# Patient Record
Sex: Female | Born: 1995 | Race: White | Hispanic: No | Marital: Married | Smoking: Never smoker
Health system: Southern US, Community
[De-identification: ages and names within clinical notes are randomized; demographics above are authoritative.]

## PROBLEM LIST (undated history)

## (undated) DIAGNOSIS — R51 Headache: Secondary | ICD-10-CM

## (undated) DIAGNOSIS — R519 Headache, unspecified: Secondary | ICD-10-CM

## (undated) HISTORY — PX: APPENDECTOMY: SHX54

## (undated) HISTORY — PX: OTHER SURGICAL HISTORY: SHX169

---

## 2018-05-25 ENCOUNTER — Ambulatory Visit: Payer: Self-pay | Admitting: Family Medicine

## 2018-06-01 LAB — OB RESULTS CONSOLE RUBELLA ANTIBODY, IGM: Rubella: IMMUNE

## 2018-06-01 LAB — OB RESULTS CONSOLE VARICELLA ZOSTER ANTIBODY, IGG: Varicella: IMMUNE

## 2018-06-01 LAB — OB RESULTS CONSOLE HIV ANTIBODY (ROUTINE TESTING): HIV: NONREACTIVE

## 2018-06-01 LAB — OB RESULTS CONSOLE HEPATITIS B SURFACE ANTIGEN: Hepatitis B Surface Ag: NEGATIVE

## 2018-06-29 LAB — OB RESULTS CONSOLE HGB/HCT, BLOOD
HCT: 41 (ref 29–41)
HEMOGLOBIN: 14
Hemoglobin: 14

## 2018-06-29 LAB — OB RESULTS CONSOLE PLATELET COUNT: Platelets: 393

## 2018-06-29 LAB — OB RESULTS CONSOLE ABO/RH

## 2018-07-03 ENCOUNTER — Ambulatory Visit (INDEPENDENT_AMBULATORY_CARE_PROVIDER_SITE_OTHER): Payer: Managed Care, Other (non HMO) | Admitting: Obstetrics and Gynecology

## 2018-07-03 ENCOUNTER — Ambulatory Visit: Payer: Managed Care, Other (non HMO) | Admitting: Certified Nurse Midwife

## 2018-07-03 ENCOUNTER — Encounter: Payer: Self-pay | Admitting: Obstetrics and Gynecology

## 2018-07-03 ENCOUNTER — Encounter: Payer: Self-pay | Admitting: Certified Nurse Midwife

## 2018-07-03 VITALS — BP 117/66 | HR 94 | Ht 63.0 in | Wt 125.3 lb

## 2018-07-03 DIAGNOSIS — Z3492 Encounter for supervision of normal pregnancy, unspecified, second trimester: Secondary | ICD-10-CM

## 2018-07-03 NOTE — Progress Notes (Signed)
Pt presents today as a NOB transfer from [facility]. Pt has no concerns, only wants to establish care.

## 2018-07-03 NOTE — Progress Notes (Signed)
Error. Pt not seen she request to see an MD.

## 2018-07-03 NOTE — Progress Notes (Signed)
NOB TRANSFER: 13 weeks and 2 days by early ultrasound.  Her first pregnancy was complicated by macrosomic infant delivered at term and a subsequent fourth degree tear.  Patient has done a lot of research regarding another vaginal delivery and she desires another vaginal delivery as opposed to C-section.  She did receive her initial prenatal labs and care at [facility] and she is obtaining records for [place].  She has no complaints today-reports nausea but rare vomiting and says this is resolving.  We have discussed genetic testing in the first trimester and she has declined this at this time.  She is still considering quad screen testing at her next visit.  Once we have obtained records we will determine any additional labs that are required.  Consider early 1 hour GCT because of history of macrosomia.  Patient says she had an examination at [facility] and they also told her she did not require a Pap smear as she was "up-to-date".

## 2018-07-24 ENCOUNTER — Encounter: Payer: Self-pay | Admitting: Obstetrics and Gynecology

## 2018-07-24 ENCOUNTER — Ambulatory Visit (INDEPENDENT_AMBULATORY_CARE_PROVIDER_SITE_OTHER): Payer: Managed Care, Other (non HMO) | Admitting: Obstetrics and Gynecology

## 2018-07-24 VITALS — BP 111/71 | HR 75 | Wt 126.7 lb

## 2018-07-24 DIAGNOSIS — Z3482 Encounter for supervision of other normal pregnancy, second trimester: Secondary | ICD-10-CM

## 2018-07-24 DIAGNOSIS — O09292 Supervision of pregnancy with other poor reproductive or obstetric history, second trimester: Secondary | ICD-10-CM

## 2018-07-24 LAB — POCT URINALYSIS DIPSTICK OB
Bilirubin, UA: NEGATIVE
Glucose, UA: NEGATIVE
Ketones, UA: NEGATIVE
LEUKOCYTES UA: NEGATIVE
NITRITE UA: NEGATIVE
PH UA: 7 (ref 5.0–8.0)
PROTEIN: NEGATIVE
RBC UA: NEGATIVE
SPEC GRAV UA: 1.01 (ref 1.010–1.025)
UROBILINOGEN UA: 0.2 U/dL

## 2018-07-24 NOTE — Progress Notes (Signed)
ROB-pt is present today for prenatal care. Pt stated that she is doing well no complaints. Pt had flu vaccine on 06/27/18.

## 2018-07-24 NOTE — Progress Notes (Signed)
ROB: Doing well today no complaints. Discussed need for early glucola due to h/o fetal macrosomia. Declines all genetic testing. Up to date on flu vaccine. RTC in 4 weeks, for anatomy scan then.

## 2018-07-24 NOTE — Patient Instructions (Signed)

## 2018-08-21 ENCOUNTER — Ambulatory Visit (INDEPENDENT_AMBULATORY_CARE_PROVIDER_SITE_OTHER): Payer: Managed Care, Other (non HMO) | Admitting: Obstetrics and Gynecology

## 2018-08-21 ENCOUNTER — Ambulatory Visit (INDEPENDENT_AMBULATORY_CARE_PROVIDER_SITE_OTHER): Payer: Managed Care, Other (non HMO)

## 2018-08-21 VITALS — BP 120/74 | HR 76 | Wt 129.0 lb

## 2018-08-21 DIAGNOSIS — Z3482 Encounter for supervision of other normal pregnancy, second trimester: Secondary | ICD-10-CM

## 2018-08-21 LAB — POCT URINALYSIS DIPSTICK OB
Bilirubin, UA: NEGATIVE
GLUCOSE, UA: NEGATIVE
KETONES UA: NEGATIVE
LEUKOCYTES UA: NEGATIVE
NITRITE UA: NEGATIVE
POC,PROTEIN,UA: NEGATIVE
RBC UA: NEGATIVE
Spec Grav, UA: 1.015 (ref 1.010–1.025)
Urobilinogen, UA: 0.2 E.U./dL
pH, UA: 7 (ref 5.0–8.0)

## 2018-08-21 NOTE — Progress Notes (Signed)
Pt here today for ROB and anatomy scan. Pt is doing well and has no concerns at this time.  BP 120/74   Pulse 76   Wt 129 lb (58.5 kg)   BMI 22.85 kg/m

## 2018-08-21 NOTE — Progress Notes (Signed)
ROB:  No complaints - FAS today.  Feels active fetal movement.

## 2018-08-27 ENCOUNTER — Telehealth: Payer: Self-pay

## 2018-08-27 NOTE — Telephone Encounter (Signed)
Received a telephone advice fax for pt. They advised pt to go the ED. Pt did not go to the ED. Pt was called no answer LM via voicemail for the reason for the call was to check on her to see how she was doing. Pt was advised to call the office.

## 2018-09-19 NOTE — L&D Delivery Note (Signed)
       Delivery Note   Emily Farley is a 23 y.o. G2P1001 at [redacted]w[redacted]d Estimated Date of Delivery: 01/07/19  PRE-OPERATIVE DIAGNOSIS:  1) [redacted]w[redacted]d pregnancy.  2) history of macrosomia with subsequent fourth degree tear  POST-OPERATIVE DIAGNOSIS:  1) [redacted]w[redacted]d pregnancy s/p Vaginal, Spontaneous   2) viable female infant  Delivery Type: Vaginal, Spontaneous    Delivery Anesthesia: Epidural   Labor Complications:   None  ESTIMATED BLOOD LOSS: 140  ml    FINDINGS:   1) female infant, Apgar scores of 8    at 1 minute and 9    at 5 minutes and a birthweight of    ounces.    2) Nuchal cord: NO  SPECIMENS:   PLACENTA:   Appearance: Intact    Removal: Spontaneous      Disposition:     DISPOSITION:  Infant to left in stable condition in the delivery room, with L&D personnel and mother,  NARRATIVE SUMMARY: Labor course:  [name] is a G2P1001 at [redacted]w[redacted]d who presented for induction of labor.  She received intravaginal misoprostol and underwent artificial rupture membranes.  She progressed well in labor without pitocin.  She received the appropriate anesthesia and proceeded to complete dilation. She evidenced good maternal expulsive effort during the second stage. She went on to deliver a viable infant. The placenta delivered without problems and was noted to be complete. A perineal and vaginal examination was performed. Episiotomy/Lacerations: 2nd degree;Perineal  Episiotomy or lacerations were repaired with Vicryl suture using local anesthesia. The patient tolerated this well.  Elonda Husky, M.D. 01/11/2019 1:56 PM

## 2018-09-20 ENCOUNTER — Other Ambulatory Visit: Payer: Self-pay | Admitting: Obstetrics and Gynecology

## 2018-09-20 ENCOUNTER — Ambulatory Visit (INDEPENDENT_AMBULATORY_CARE_PROVIDER_SITE_OTHER): Payer: Managed Care, Other (non HMO)

## 2018-09-20 ENCOUNTER — Ambulatory Visit (INDEPENDENT_AMBULATORY_CARE_PROVIDER_SITE_OTHER): Payer: Managed Care, Other (non HMO) | Admitting: Obstetrics and Gynecology

## 2018-09-20 VITALS — BP 101/66 | HR 73 | Wt 134.4 lb

## 2018-09-20 DIAGNOSIS — Z3492 Encounter for supervision of normal pregnancy, unspecified, second trimester: Secondary | ICD-10-CM

## 2018-09-20 DIAGNOSIS — Z130 Encounter for screening for diseases of the blood and blood-forming organs and certain disorders involving the immune mechanism: Secondary | ICD-10-CM

## 2018-09-20 DIAGNOSIS — Z3482 Encounter for supervision of other normal pregnancy, second trimester: Secondary | ICD-10-CM

## 2018-09-20 DIAGNOSIS — Z131 Encounter for screening for diabetes mellitus: Secondary | ICD-10-CM

## 2018-09-20 LAB — POCT URINALYSIS DIPSTICK OB
Bilirubin, UA: NEGATIVE
Blood, UA: NEGATIVE
Glucose, UA: NEGATIVE
Ketones, UA: NEGATIVE
Leukocytes, UA: NEGATIVE
Nitrite, UA: NEGATIVE
POC,PROTEIN,UA: NEGATIVE
Spec Grav, UA: 1.02 (ref 1.010–1.025)
UROBILINOGEN UA: 0.2 U/dL
pH, UA: 5 (ref 5.0–8.0)

## 2018-09-20 NOTE — Progress Notes (Signed)
ROB-Pt stated that she is doing well no complaints.  

## 2018-09-20 NOTE — Progress Notes (Signed)
ROB: Doing well, no complaints. RTC in 4 weeks. For 28 week labs at that time.

## 2018-10-18 ENCOUNTER — Other Ambulatory Visit: Payer: Managed Care, Other (non HMO)

## 2018-10-18 ENCOUNTER — Ambulatory Visit (INDEPENDENT_AMBULATORY_CARE_PROVIDER_SITE_OTHER): Payer: Managed Care, Other (non HMO) | Admitting: Obstetrics and Gynecology

## 2018-10-18 ENCOUNTER — Encounter: Payer: Self-pay | Admitting: Obstetrics and Gynecology

## 2018-10-18 VITALS — BP 102/69 | HR 69 | Ht 63.0 in | Wt 138.7 lb

## 2018-10-18 DIAGNOSIS — Z3482 Encounter for supervision of other normal pregnancy, second trimester: Secondary | ICD-10-CM

## 2018-10-18 DIAGNOSIS — Z23 Encounter for immunization: Secondary | ICD-10-CM

## 2018-10-18 DIAGNOSIS — Z130 Encounter for screening for diseases of the blood and blood-forming organs and certain disorders involving the immune mechanism: Secondary | ICD-10-CM

## 2018-10-18 DIAGNOSIS — O09292 Supervision of pregnancy with other poor reproductive or obstetric history, second trimester: Secondary | ICD-10-CM

## 2018-10-18 LAB — POCT URINALYSIS DIPSTICK OB
Bilirubin, UA: NEGATIVE
Blood, UA: NEGATIVE
Glucose, UA: NEGATIVE
KETONES UA: NEGATIVE
Leukocytes, UA: NEGATIVE
Nitrite, UA: NEGATIVE
POC,PROTEIN,UA: NEGATIVE
Spec Grav, UA: 1.01 (ref 1.010–1.025)
Urobilinogen, UA: 0.2 E.U./dL
pH, UA: 7 (ref 5.0–8.0)

## 2018-10-18 NOTE — Progress Notes (Signed)
Patient comes in today for ROB visit. She has no concerns today. Tdap given today. Blood transfusion form signed today. She is having RPR, CBC, and 1 hour glucose today.

## 2018-10-18 NOTE — Progress Notes (Signed)
ROB: Patient doing well without complaint.  1 hour GCT today, Tdap today.  Watch fundal height consider ultrasound if necessary.

## 2018-10-19 LAB — GLUCOSE, 1 HOUR GESTATIONAL: GESTATIONAL DIABETES SCREEN: 80 mg/dL (ref 65–139)

## 2018-10-19 LAB — CBC
HEMATOCRIT: 37.9 % (ref 34.0–46.6)
Hemoglobin: 13.2 g/dL (ref 11.1–15.9)
MCH: 30.8 pg (ref 26.6–33.0)
MCHC: 34.8 g/dL (ref 31.5–35.7)
MCV: 88 fL (ref 79–97)
Platelets: 273 10*3/uL (ref 150–450)
RBC: 4.29 x10E6/uL (ref 3.77–5.28)
RDW: 12.3 % (ref 11.7–15.4)
WBC: 13.8 10*3/uL — ABNORMAL HIGH (ref 3.4–10.8)

## 2018-10-19 LAB — RPR: RPR Ser Ql: NONREACTIVE

## 2018-11-13 ENCOUNTER — Ambulatory Visit (INDEPENDENT_AMBULATORY_CARE_PROVIDER_SITE_OTHER): Payer: Managed Care, Other (non HMO) | Admitting: Obstetrics and Gynecology

## 2018-11-13 ENCOUNTER — Encounter: Payer: Self-pay | Admitting: Obstetrics and Gynecology

## 2018-11-13 VITALS — BP 103/67 | HR 84 | Wt 145.4 lb

## 2018-11-13 DIAGNOSIS — Z3483 Encounter for supervision of other normal pregnancy, third trimester: Secondary | ICD-10-CM

## 2018-11-13 LAB — POCT URINALYSIS DIPSTICK OB
Bilirubin, UA: NEGATIVE
Blood, UA: NEGATIVE
Glucose, UA: NEGATIVE
KETONES UA: NEGATIVE
Leukocytes, UA: NEGATIVE
Nitrite, UA: NEGATIVE
POC,PROTEIN,UA: NEGATIVE
Spec Grav, UA: 1.02 (ref 1.010–1.025)
Urobilinogen, UA: 0.2 E.U./dL
pH, UA: 6.5 (ref 5.0–8.0)

## 2018-11-13 NOTE — Patient Instructions (Signed)
Pain Relief During Labor and Delivery  Many things can cause pain during labor and delivery, including:  · Pressure on bones and ligaments due to the baby moving through the pelvis.  · Stretching of tissues due to the baby moving through the birth canal.  · Muscle tension due to anxiety or nervousness.  · The uterus tightening (contracting) and relaxing to help move the baby.  There are many ways to deal with the pain of labor and delivery. They include:  · Taking prenatal classes. Taking these classes helps you know what to expect during your baby’s birth. What you learn will increase your confidence and decrease your anxiety.  · Practicing relaxation techniques or doing relaxing activities, such as:  ? Focused breathing.  ? Meditation.  ? Visualization.  ? Aroma therapy.  ? Listening to your favorite music.  ? Hypnosis.  · Taking a warm shower or bath (hydrotherapy). This may:  ? Provide comfort and relaxation.  ? Lessen your perception of pain.  ? Decrease the amount of pain medicine needed.  ? Decrease the length of labor.  · Getting a massage or counterpressure on your back.  · Applying warm packs or ice packs.  · Changing positions often, moving around, or using a birthing ball.  · Getting:  ? Pain medicine through an IV or injection into a muscle.  ? Pain medicine inserted into your spinal column.  ? Injections of sterile water just under the skin on your lower back (intradermal injections).  ? Laughing gas (nitrous oxide).  Discuss your pain control options with your health care provider during your prenatal visits. Explore the options offered by your hospital or birth center.  What kinds of medicine are available?  There are two kinds of medicines that can be used to relieve pain during labor and delivery:  · Analgesics. These medicines decrease pain without causing you to lose feeling or the ability to move your muscles.  · Anesthetics. These medicines block feeling in the body and can decrease your  ability to move freely.  Both of these kinds of medicine can cause minor side effects, such as nausea, trouble concentrating, and sleepiness. They can also decrease the baby's heart rate before birth and affect the baby’s breathing rate after birth. For this reason, health care providers are careful about when and how much medicine is given.  What are specific medicines and procedures that provide pain relief?  Local Anesthetics  Local anesthetics are used to numb a small area of the body. They may be used along with another kind of anesthetic or used to numb the nerves of the vagina, cervix, and perineum during the second stage of labor.  General Anesthetics  General anesthetics cause you to lose consciousness so you do not feel pain. They are usually only used for an emergency cesarean delivery. General anesthetics are given through an IV tube and a mask.  Pudendal Block  A pudendal block is a form of local anesthetic. It may be used to relieve the pain associated with pushing or stretching of the perineum at the time of delivery or to further numb the perineum. A pudendal block is done by injecting numbing medicine through the vaginal wall into a nerve in the pelvis.  Epidural Analgesia  Epidural analgesia is given through a flexible IV catheter that is inserted into the lower back. Numbing medicine is delivered continuously to the area near your spinal column nerves (epidural space). After having this type of analgesia, you   may be able to move your legs but you most likely will not be able to walk. Depending on the amount of medicine given, you may lose all feeling in the lower half of your body, or you may retain some level of sensation, including the urge to push. Epidural analgesia can be used to provide pain relief for a vaginal birth.  Spinal Block  A spinal block is similar to epidural analgesia, but the medicine is injected into the spinal fluid instead of the epidural space. A spinal block is only given  once. It starts to relieve pain quickly, but the pain relief lasts only 1-6 hours. Spinal blocks can be used for cesarean deliveries.  Combined Spinal-Epidural (CSE) Block  A CSE block combines the effects of a spinal block and epidural analgesia. The spinal block works quickly to block all pain. The epidural analgesia provides continuous pain relief, even after the effects of the spinal block have worn off.  This information is not intended to replace advice given to you by your health care provider. Make sure you discuss any questions you have with your health care provider.  Document Released: 12/22/2008 Document Revised: 02/12/2016 Document Reviewed: 01/27/2016  Elsevier Interactive Patient Education © 2019 Elsevier Inc.

## 2018-11-13 NOTE — Progress Notes (Signed)
ROB: Doing well, no complaints.  Desires circumcision for female infant. Discussed pain management, unsure if she desires epidural this time around, but worried about having another large baby. Discussed concerns. RTC in 2 weeks. Continue to monitor fetal growth.

## 2018-11-13 NOTE — Progress Notes (Signed)
ROB-pt present today for routine prenatal care. Pt stated that she is doing well no problems.

## 2018-11-27 ENCOUNTER — Ambulatory Visit (INDEPENDENT_AMBULATORY_CARE_PROVIDER_SITE_OTHER): Payer: Managed Care, Other (non HMO) | Admitting: Obstetrics and Gynecology

## 2018-11-27 ENCOUNTER — Encounter: Payer: Self-pay | Admitting: Obstetrics and Gynecology

## 2018-11-27 VITALS — BP 101/65 | HR 84 | Ht 63.0 in | Wt 146.8 lb

## 2018-11-27 DIAGNOSIS — Z3483 Encounter for supervision of other normal pregnancy, third trimester: Secondary | ICD-10-CM

## 2018-11-27 LAB — POCT URINALYSIS DIPSTICK OB
Bilirubin, UA: NEGATIVE
Blood, UA: NEGATIVE
Glucose, UA: NEGATIVE
KETONES UA: NEGATIVE
Leukocytes, UA: NEGATIVE
Nitrite, UA: NEGATIVE
PROTEIN: NEGATIVE
Spec Grav, UA: 1.01 (ref 1.010–1.025)
Urobilinogen, UA: 0.2 E.U./dL
pH, UA: 6 (ref 5.0–8.0)

## 2018-11-27 NOTE — Progress Notes (Signed)
Patient comes in today for ROB visit. No concerns today. 

## 2018-11-27 NOTE — Progress Notes (Signed)
ROB: Patient without complaint.  Cultures next visit.

## 2018-12-10 ENCOUNTER — Telehealth: Payer: Self-pay

## 2018-12-10 NOTE — Telephone Encounter (Signed)
Pt called no answer LM to call the office to go over covid-19 questions pre-screening before her visit.

## 2018-12-10 NOTE — Telephone Encounter (Signed)
Pt called no answer went straight to voicemail left message via voicemail that I was calling to go over pre-screening questions. Pt was advised to please call the office as soon as possible.

## 2018-12-11 ENCOUNTER — Encounter: Payer: Managed Care, Other (non HMO) | Admitting: Obstetrics and Gynecology

## 2018-12-11 ENCOUNTER — Telehealth: Payer: Self-pay

## 2018-12-11 NOTE — Telephone Encounter (Signed)
Pt called to see if she could come in tomorrow morning at 8am instead of Thursday morning at 8am. Message was left asking pt to call to see if she was able to change her appointment or not.

## 2018-12-13 ENCOUNTER — Other Ambulatory Visit: Payer: Self-pay

## 2018-12-13 ENCOUNTER — Ambulatory Visit (INDEPENDENT_AMBULATORY_CARE_PROVIDER_SITE_OTHER): Payer: Managed Care, Other (non HMO) | Admitting: Obstetrics and Gynecology

## 2018-12-13 ENCOUNTER — Encounter: Payer: Self-pay | Admitting: Obstetrics and Gynecology

## 2018-12-13 VITALS — BP 112/71 | HR 98 | Wt 150.1 lb

## 2018-12-13 DIAGNOSIS — Z3483 Encounter for supervision of other normal pregnancy, third trimester: Secondary | ICD-10-CM

## 2018-12-13 LAB — POCT URINALYSIS DIPSTICK OB
BILIRUBIN UA: NEGATIVE
Glucose, UA: NEGATIVE
Ketones, UA: NEGATIVE
Leukocytes, UA: NEGATIVE
Nitrite, UA: NEGATIVE
POC,PROTEIN,UA: NEGATIVE
RBC UA: NEGATIVE
Spec Grav, UA: 1.01 (ref 1.010–1.025)
Urobilinogen, UA: 0.2 E.U./dL
pH, UA: 7.5 (ref 5.0–8.0)

## 2018-12-13 LAB — OB RESULTS CONSOLE GC/CHLAMYDIA: Gonorrhea: NEGATIVE

## 2018-12-13 LAB — OB RESULTS CONSOLE GBS: GBS: NEGATIVE

## 2018-12-13 NOTE — Progress Notes (Signed)
ROB-Pt present today for prenatal care. Pt stated that she was doing well. Pt stated Fetal movement present, no pressure and pain and  contractions expect for braxton hicks and vaginal bleeding.

## 2018-12-13 NOTE — Patient Instructions (Signed)
                                                                                                                 FREQUENTLY ASKED QUESTIONS FOR OBSTETRICS/PEDIATRICS    Q: Why are visitor restrictions different for maternity care areas?  [place] is restricting visitors for the duration of the patient's hospitalization. The birth of a child involves the mother, considered the patient, and a birthing partner. These are unprecedented times and we are making the exception to allow a birthing partner to be a part of the patient unit. No other guests will be allowed in our Women's & Children's Center at [facility] and at [facility].   Q: Are credentialed doulas allowed to support their existing patients?  We acknowledge the value these doula partnerships offer our care teams and many birthing families in our communities. Each laboring mother is allowed one birthing partner of the patient's choosing for her entire hospitalization.   Q: Are visitor restrictions different for hospitalized children?  Pediatric patients (infants and children under 17 years of age), such as those in the Children's Unit, Pediatric ICU and NICU, will be allowed two visitors (parents or legal guardians)   Q: Are pregnant women at an increased risk for COVID-19?  The American College of Obstetricians and Gynecologists (ACOG) is monitoring closely the coronavirus pandemic. With the limited information available, data does not indicate pregnant women are at an increased risk. However, pregnant women are known to be at greater risk for respiratory infections like flu. With that in mind, expectant mothers are considered an at-risk population for COVID-19, according to ACOG.   Q: Are newborns at an increased risk for COVID-19?  A limited sample of COVID-19 data with newborns indicates the virus is not transferred to the infant during pregnancy. However, postpartum separation is recommended by the Centers for  Disease Control (CDC). As a result [place] recommends and strongly encourages temporary separation of moms and babies who test positive for COVID-19 or are awaiting results to rule out COVID-19 based on CDC guidelines.   Q: If you have a suspected case of COVID-19, is the NICU couplet care room an option?  No. If either patient is considered at-risk for having COVID-19, the Women's & Children's Center at [facility] will not use the NICU couplet care rooms for that family.   Q: [place] is urging that elective procedures be postponed. What is considered elective for women's and children's service line?  NOT ELECTIVE: Obstetric procedures, even those with an element of choice on timing, are not considered elective. Circumcisions are considered elective procedures, however, these do not deplete blood products and other resources, which is the spirit in which the COVID-19 postponement of elective procedures was intended. Therefore, circumcisions will be allowed.   ELECTIVE: Postpartum tubal ligations are considered elective and should be postponed. Q&A for Obstetricians, Gynecologists and Pediatricians  Published December 07, 2018   [place] supports as much as possible the medical care   team working with the patient's individual needs to address timing during these unprecedented times. We seek the support of our medical care team in preserving needed resources throughout our crisis response to COVID-19.   Q: How does COVID-19 impact breastfeeding?  Breastmilk is safe for your baby - even if the mother has tested positive for COVID-19. If a COVID-19+ mother decides to breastfeed while inpatient and after discharge, we suggest proper protective equipment be worn and hand hygiene be performed before and after feeding the infant. The new mother also has the option to pump her milk and have a healthy family member feed the baby to protect the baby from getting the virus.   Q: Should we urge  patients to avoid baby showers and large gatherings?  Yes. As has been recommended for all citizens in our communities, gatherings of 10 or more should be avoided - pregnant or not. Seek creative options for "hosting" baby showers through electronic means that honor the request for social distancing during this time of heightened awareness.   Q: Should patients miss their prenatal appointments?  No. Prenatal visits are NOT elective. While we want to limit contact and exposure, prenatal care is vital right now. Contact your physician's office if you have concerns about your visits. We are limiting outpatient office visits to the patient and one guest in order to reduce the potential for exposure.   Q: What if a pregnant woman feels sick? Should she miss her prenatal visit then?  A pregnant woman experiencing coronavirus-like symptoms (i.e., cough, fever, difficulty breathing, shortness of breath, gastrointestinal issues) should contact her pregnancy care provider by phone. Her medical professional can best determine whether she should use a video visit or possibly go to a collection site to be tested for COVID-19. Contacting her primary care provider or her pregnancy care provider is her first step.   Q: What can I do about childbirth education? All the classes are cancelled.  The Women's [facility] Children's Centers will offer online learning to support mothers on their journey. We currently offer Understanding Childbirth, Understanding Breastfeeding and Understanding Newborn Care as an online class. Please visit our website, [email or web address], to register for an online class.   Q: How can I keep from getting COVID-19? Q&A for Obstetricians, Gynecologists and Pediatricians  Published December 07, 2018   Together, we can reduce the risk of exposure to the virus and help you and your family remain healthy and safe. One of the best ways to protect yourself is to wash your hands frequently using soap and  water. Also, you should avoid touching your eyes, nose and mouth with unwashed hands, avoid physical contact with others and practice social distancing.   Q: How are employees being informed about what to do?  [place] leaders receive a daily COVID-19 update and share relevant information with their teams. This is a time when health care professionals are called on to lead within our community. We appreciate our staff's engagement with our COVID-19 updates and encourage them to share best practices on reducing the spread of the virus with our patients and community. We are prepared to provide the exceptional COVID-19 care and coordination our community needs, expects and deserves.   Q: Who's in charge of this issue at [facility]?  The leadership structure and process established to address COVID-19 includes Chief Physician Executive Bruce Swords, MD; Infection Prevention Medical Director Cynthia Snider, MD; and Infection Prevention Interim Director Sara Wall, MSN, RN, CIC, CSPDT. A team   of [place] experts reflecting a broad spectrum of our workforce is meeting daily to evaluate new information we receive about COVID-19 and to adapt policies and practices accordingly.                         Published December 07, 2018    

## 2018-12-13 NOTE — Progress Notes (Signed)
ROB: Patient doing well, no complaints. Discussed coronavirus in pregnancy, hospital restrictions.  36 week labs done today.  RTC in 2 weeks.

## 2018-12-15 LAB — STREP GP B NAA+RFLX: Strep Gp B NAA+Rflx: NEGATIVE

## 2018-12-15 LAB — GC/CHLAMYDIA PROBE AMP
Chlamydia trachomatis, NAA: NEGATIVE
Neisseria Gonorrhoeae by PCR: NEGATIVE

## 2018-12-25 ENCOUNTER — Telehealth: Payer: Self-pay

## 2018-12-25 NOTE — Telephone Encounter (Signed)
Pt called no answer LM to call the office to go over her prescreening before her visit tomorrow.

## 2018-12-26 ENCOUNTER — Ambulatory Visit (INDEPENDENT_AMBULATORY_CARE_PROVIDER_SITE_OTHER): Payer: Managed Care, Other (non HMO) | Admitting: Obstetrics and Gynecology

## 2018-12-26 ENCOUNTER — Encounter: Payer: Self-pay | Admitting: Obstetrics and Gynecology

## 2018-12-26 ENCOUNTER — Other Ambulatory Visit: Payer: Self-pay

## 2018-12-26 VITALS — BP 104/74 | HR 102 | Ht 63.0 in | Wt 153.0 lb

## 2018-12-26 DIAGNOSIS — Z3483 Encounter for supervision of other normal pregnancy, third trimester: Secondary | ICD-10-CM

## 2018-12-26 LAB — POCT URINALYSIS DIPSTICK OB
Bilirubin, UA: NEGATIVE
Blood, UA: NEGATIVE
Glucose, UA: NEGATIVE
Ketones, UA: NEGATIVE
Leukocytes, UA: NEGATIVE
Nitrite, UA: NEGATIVE
POC,PROTEIN,UA: NEGATIVE
Spec Grav, UA: 1.015
Urobilinogen, UA: 0.2 U/dL
pH, UA: 6.5

## 2018-12-26 NOTE — Patient Instructions (Signed)
Q: Why are visitor restrictions different for maternity care areas? [place] is restricting visitors for the duration of the patient's hospitalization. The birth of a child involves the mother, considered the patient, and a birthing partner. These are unprecedented times and we are making the exception to allow a birthing partner to be a part of the patient unit. No other guests will be allowed in our Women's & Children's Center at [facility] and at [facility].  Q: Are credentialed doulas allowed to support their existing patients? We acknowledge the value these doula partnerships offer our care teams and many birthing families in our communities. Each laboring mother is allowed one birthing partner of the patient's choosing for her entire hospitalization.  Q: Are visitor restrictions different for hospitalized children? Pediatric patients (infants and children under 17 years of age), such as those in the Children's Unit, Pediatric ICU and NICU, will be allowed two visitors (parents or legal guardians)  Q: Are pregnant women at an increased risk for COVID-19? The American College of Obstetricians and Gynecologists (ACOG) is monitoring closely the coronavirus pandemic. With the limited information available, data does not indicate pregnant women are at an increased risk. However, pregnant women are known to be at greater risk for respiratory infections like flu. With that in mind, expectant mothers are considered an at-risk population for COVID-19, according to ACOG.  Q: Are newborns at an increased risk for COVID-19? A limited sample of COVID-19 data with newborns indicates the virus is not transferred to the infant during pregnancy. However, postpartum separation is recommended by the Centers for Disease Control (CDC). As a result [place] recommends and strongly encourages temporary separation of moms and babies who test positive for COVID-19 or are awaiting results to rule out  COVID-19 based on CDC guidelines.  Q: If you have a suspected case of COVID-19, is the NICU couplet care room an option? No. If either patient is considered at-risk for having COVID-19, the [facility] Center at  Hospital will not use the NICU couplet care rooms for that family.  Q: [place] is urging that elective procedures be postponed. What is considered elective for women's and children's service line? NOT ELECTIVE: Obstetric procedures, even those with an element of choice on timing, are not considered elective. Circumcisions are considered elective procedures, however, these do not deplete blood products and other resources, which is the spirit in which the COVID-19 postponement of elective procedures was intended. Therefore, circumcisions will be allowed.  ELECTIVE: Postpartum tubal ligations are considered elective and should be postponed.   supports as much as possible the medical care team working with the patient's individual needs to address timing during these unprecedented times. We seek the support of our medical care team in preserving needed resources throughout our crisis response to COVID-19.  Q: How does COVID-19 impact breastfeeding? Breastmilk is safe for your baby - even if the mother has tested positive for COVID-19. We suggest the mother pump her milk and have a healthy family member feed the baby to protect the baby from getting the virus.  If a COVID-19+ mother decides to breastfeed after discharge, we suggest proper protective equipment be worn and hand hygiene be performed before and after feeding the infant.  Q: Should we urge patients to avoid baby showers and large gatherings? Yes. As has been recommended for all citizens in our communities, gatherings of 10 or more should be avoided - pregnant or not. Seek creative options   for "hosting" baby showers through electronic means that honor the request for social distancing during this  time of heightened awareness.  Q: Should patients miss their prenatal appointments? No. Prenatal visits are NOT elective. While we want to limit contact and exposure, prenatal care is vital right now. Contact your physician's office if you have concerns about your visits. We are limiting outpatient office visits to the patient and one guest in order to reduce the potential for exposure.  Q: What if a pregnant woman feels sick? Should she miss her prenatal visit then? A pregnant woman experiencing coronavirus-like symptoms (i.e., cough, fever, difficulty breathing, shortness of breath, gastrointestinal issues) should contact her pregnancy care provider by phone. Her medical professional can best determine whether she should use a video visit or possibly go to a collection site to be tested for COVID-19. Contacting her primary care provider or her pregnancy care provider is her first step.  Q: What can I do about childbirth education? All the classes are cancelled. The [facility] Children[facility] Centers will offer online learning to support mothers on their journey.  We currently offer Understanding Childbirth, Understanding Breastfeeding and Understanding Newborn Care as an online class.  Please visit our website, [email or web address], to register for an online class.  Q: How can I keep from getting COVID-19? Together, we can reduce the risk of exposure to the virus and help you and your family remain healthy and safe. One of the best ways to protect yourself is to wash your hands frequently using soap and water. Also, you should avoid touching your eyes, nose and mouth with unwashed hands, avoid physical contact with others and practice social distancing.   

## 2018-12-26 NOTE — Progress Notes (Signed)
ROB: Patient without complaint.  COVID-19 discussed.  Signs and symptoms of labor discussed.

## 2018-12-26 NOTE — Progress Notes (Signed)
Patient comes in today for ROB visit. She has no concerns today.  

## 2018-12-27 ENCOUNTER — Telehealth: Payer: Self-pay

## 2018-12-27 NOTE — Telephone Encounter (Signed)
Pt called no answer LM to call the office to go over prescreening questions for COVID-19 before her visit on Monday.

## 2018-12-27 NOTE — Progress Notes (Signed)
ROB-Pt present today for prenatal care. Pt stated that she was well. Pt stated that she was having fetal movement, some pressure in the lower abd and no pain. Pt stated having contractions, but nothing regular, and stated no vaginal bleeding.

## 2018-12-31 ENCOUNTER — Encounter: Payer: Self-pay | Admitting: Obstetrics and Gynecology

## 2018-12-31 ENCOUNTER — Other Ambulatory Visit: Payer: Self-pay

## 2018-12-31 ENCOUNTER — Ambulatory Visit (INDEPENDENT_AMBULATORY_CARE_PROVIDER_SITE_OTHER): Payer: Managed Care, Other (non HMO) | Admitting: Obstetrics and Gynecology

## 2018-12-31 VITALS — BP 104/71 | HR 91 | Wt 153.8 lb

## 2018-12-31 DIAGNOSIS — Z3483 Encounter for supervision of other normal pregnancy, third trimester: Secondary | ICD-10-CM

## 2018-12-31 DIAGNOSIS — Z8759 Personal history of other complications of pregnancy, childbirth and the puerperium: Secondary | ICD-10-CM

## 2018-12-31 DIAGNOSIS — O09293 Supervision of pregnancy with other poor reproductive or obstetric history, third trimester: Secondary | ICD-10-CM

## 2018-12-31 DIAGNOSIS — O480 Post-term pregnancy: Secondary | ICD-10-CM

## 2018-12-31 LAB — POCT URINALYSIS DIPSTICK OB
Bilirubin, UA: NEGATIVE
Blood, UA: NEGATIVE
Glucose, UA: NEGATIVE
Ketones, UA: NEGATIVE
Nitrite, UA: NEGATIVE
Spec Grav, UA: 1.02 (ref 1.010–1.025)
Urobilinogen, UA: 0.2 E.U./dL
pH, UA: 7 (ref 5.0–8.0)

## 2018-12-31 NOTE — Progress Notes (Signed)
ROB: Doing well, no complaints. Does note contractions occasionally. RTC in 1 week. Discussed IOL for postdates pregnancy by 41 weeks if no labor. Notes she was post-dates with her last pregnancy as well requiring induction.  For growth/BPP next visit.

## 2019-01-07 ENCOUNTER — Inpatient Hospital Stay: Admission: AD | Admit: 2019-01-07 | Payer: Managed Care, Other (non HMO) | Source: Home / Self Care

## 2019-01-08 ENCOUNTER — Telehealth: Payer: Self-pay

## 2019-01-08 NOTE — Telephone Encounter (Signed)
Went over Emily Farley. Pt stated that she was not having any symptoms. Pt is also aware of the no visitor policy.

## 2019-01-09 ENCOUNTER — Other Ambulatory Visit: Payer: Self-pay

## 2019-01-09 ENCOUNTER — Encounter: Payer: Self-pay | Admitting: Obstetrics and Gynecology

## 2019-01-09 ENCOUNTER — Ambulatory Visit (INDEPENDENT_AMBULATORY_CARE_PROVIDER_SITE_OTHER): Payer: Managed Care, Other (non HMO) | Admitting: Obstetrics and Gynecology

## 2019-01-09 ENCOUNTER — Ambulatory Visit (INDEPENDENT_AMBULATORY_CARE_PROVIDER_SITE_OTHER): Payer: Managed Care, Other (non HMO)

## 2019-01-09 VITALS — BP 122/79 | HR 88 | Ht 63.0 in | Wt 155.2 lb

## 2019-01-09 DIAGNOSIS — O480 Post-term pregnancy: Secondary | ICD-10-CM | POA: Diagnosis not present

## 2019-01-09 DIAGNOSIS — Z3A40 40 weeks gestation of pregnancy: Secondary | ICD-10-CM

## 2019-01-09 DIAGNOSIS — Z3483 Encounter for supervision of other normal pregnancy, third trimester: Secondary | ICD-10-CM

## 2019-01-09 DIAGNOSIS — Z8759 Personal history of other complications of pregnancy, childbirth and the puerperium: Secondary | ICD-10-CM

## 2019-01-09 NOTE — Progress Notes (Signed)
ROB: Rare contractions.  Patient without complaint.  Reports daily fetal movement.  Ultrasound today reveals 6/8 BPP, but I have reviewed the ultrasound and I believe it should be 8/8.  The AFI is low normal at 5.4 and the single deepest pocket is 3 cm.  Induction discussed for next Tuesday.  Will plan earlier induction if circumstances in labor and delivery permit.  Fetal kick counts discussed and patient to let us know if there is any change in fetal movement.

## 2019-01-09 NOTE — Progress Notes (Signed)
Patient comes in today for ROB visit. She has no concerns today.  

## 2019-01-11 ENCOUNTER — Inpatient Hospital Stay: Payer: Managed Care, Other (non HMO) | Admitting: Anesthesiology

## 2019-01-11 ENCOUNTER — Inpatient Hospital Stay
Admission: EM | Admit: 2019-01-11 | Discharge: 2019-01-12 | DRG: 807 | Disposition: A | Discharge status: Home / Self Care | Payer: Managed Care, Other (non HMO) | Attending: Obstetrics and Gynecology | Admitting: Obstetrics and Gynecology

## 2019-01-11 ENCOUNTER — Other Ambulatory Visit: Payer: Self-pay

## 2019-01-11 ENCOUNTER — Encounter: Payer: Self-pay | Admitting: *Deleted

## 2019-01-11 DIAGNOSIS — Z3A40 40 weeks gestation of pregnancy: Secondary | ICD-10-CM

## 2019-01-11 DIAGNOSIS — O480 Post-term pregnancy: Secondary | ICD-10-CM | POA: Diagnosis present

## 2019-01-11 HISTORY — DX: Post-term pregnancy: O48.0

## 2019-01-11 HISTORY — DX: Headache: R51

## 2019-01-11 HISTORY — DX: Headache, unspecified: R51.9

## 2019-01-11 LAB — CBC
HCT: 37.5 % (ref 36.0–46.0)
Hemoglobin: 12.8 g/dL (ref 12.0–15.0)
MCH: 29.7 pg (ref 26.0–34.0)
MCHC: 34.1 g/dL (ref 30.0–36.0)
MCV: 87 fL (ref 80.0–100.0)
Platelets: 201 10*3/uL (ref 150–400)
RBC: 4.31 MIL/uL (ref 3.87–5.11)
RDW: 13.4 % (ref 11.5–15.5)
WBC: 11.1 10*3/uL — ABNORMAL HIGH (ref 4.0–10.5)
nRBC: 0 % (ref 0.0–0.2)

## 2019-01-11 LAB — TYPE AND SCREEN
ABO/RH(D): O POS
Antibody Screen: NEGATIVE

## 2019-01-11 MED ORDER — DOCUSATE SODIUM 100 MG PO CAPS
100.0000 mg | ORAL_CAPSULE | Freq: Two times a day (BID) | ORAL | Status: DC
  Administered 2019-01-11: 100 mg via ORAL
  Filled 2019-01-11 (×2): qty 1

## 2019-01-11 MED ORDER — SOD CITRATE-CITRIC ACID 500-334 MG/5ML PO SOLN: 30.0000 mL | ORAL | Status: DC | PRN

## 2019-01-11 MED ORDER — ZOLPIDEM TARTRATE 5 MG PO TABS: 5.0000 mg | ORAL_TABLET | Freq: Every evening | ORAL | Status: DC | PRN

## 2019-01-11 MED ORDER — AMMONIA AROMATIC IN INHA
RESPIRATORY_TRACT | Status: AC
  Filled 2019-01-11: qty 10

## 2019-01-11 MED ORDER — ONDANSETRON HCL 4 MG/2ML IJ SOLN: 4.0000 mg | Freq: Four times a day (QID) | INTRAMUSCULAR | Status: DC | PRN

## 2019-01-11 MED ORDER — BUTORPHANOL TARTRATE 2 MG/ML IJ SOLN: 1.0000 mg | INTRAMUSCULAR | Status: DC | PRN

## 2019-01-11 MED ORDER — OXYTOCIN 10 UNIT/ML IJ SOLN
INTRAMUSCULAR | Status: AC
  Filled 2019-01-11: qty 2

## 2019-01-11 MED ORDER — PRENATAL MULTIVITAMIN CH
1.0000 | ORAL_TABLET | Freq: Every day | ORAL | Status: DC
  Filled 2019-01-11: qty 1

## 2019-01-11 MED ORDER — OXYTOCIN 40 UNITS IN NORMAL SALINE INFUSION - SIMPLE MED
2.5000 [IU]/h | INTRAVENOUS | Status: DC | PRN
  Filled 2019-01-11: qty 1000

## 2019-01-11 MED ORDER — DIPHENHYDRAMINE HCL 25 MG PO CAPS: 25.0000 mg | ORAL_CAPSULE | Freq: Four times a day (QID) | ORAL | Status: DC | PRN

## 2019-01-11 MED ORDER — FENTANYL-BUPIVACAINE-NACL 0.5-0.125-0.9 MG/250ML-% EP SOLN
12.0000 mL/h | EPIDURAL | Status: DC | PRN
  Filled 2019-01-11: qty 250

## 2019-01-11 MED ORDER — EPHEDRINE 5 MG/ML INJ: 10.0000 mg | INTRAVENOUS | Status: DC | PRN

## 2019-01-11 MED ORDER — LACTATED RINGERS IV SOLN: 500.0000 mL | Freq: Once | INTRAVENOUS | Status: DC

## 2019-01-11 MED ORDER — LACTATED RINGERS IV SOLN: 500.0000 mL | INTRAVENOUS | Status: DC | PRN

## 2019-01-11 MED ORDER — OXYTOCIN 40 UNITS IN NORMAL SALINE INFUSION - SIMPLE MED
2.5000 [IU]/h | INTRAVENOUS | Status: DC
  Filled 2019-01-11: qty 1000

## 2019-01-11 MED ORDER — IBUPROFEN 600 MG PO TABS
600.0000 mg | ORAL_TABLET | Freq: Four times a day (QID) | ORAL | Status: DC
  Administered 2019-01-11 – 2019-01-12 (×3): 600 mg via ORAL
  Filled 2019-01-11 (×4): qty 1

## 2019-01-11 MED ORDER — LACTATED RINGERS IV SOLN
INTRAVENOUS | Status: DC
  Administered 2019-01-11: 08:00:00 via INTRAVENOUS

## 2019-01-11 MED ORDER — OXYTOCIN BOLUS FROM INFUSION
500.0000 mL | Freq: Once | INTRAVENOUS | Status: AC
  Administered 2019-01-11: 500 mL via INTRAVENOUS

## 2019-01-11 MED ORDER — PHENYLEPHRINE 40 MCG/ML (10ML) SYRINGE FOR IV PUSH (FOR BLOOD PRESSURE SUPPORT): 80.0000 ug | PREFILLED_SYRINGE | INTRAVENOUS | Status: DC | PRN

## 2019-01-11 MED ORDER — FENTANYL 2.5 MCG/ML W/ROPIVACAINE 0.15% IN NS 100 ML EPIDURAL (ARMC)
EPIDURAL | Status: AC
  Filled 2019-01-11: qty 100

## 2019-01-11 MED ORDER — DIPHENHYDRAMINE HCL 50 MG/ML IJ SOLN: 12.5000 mg | INTRAMUSCULAR | Status: DC | PRN

## 2019-01-11 MED ORDER — LIDOCAINE HCL (PF) 1 % IJ SOLN: 30.0000 mL | INTRAMUSCULAR | Status: AC | PRN

## 2019-01-11 MED ORDER — MISOPROSTOL 50MCG HALF TABLET
ORAL_TABLET | ORAL | Status: AC
  Administered 2019-01-11: 08:00:00 50 ug
  Filled 2019-01-11: qty 1

## 2019-01-11 MED ORDER — FENTANYL 2.5 MCG/ML W/ROPIVACAINE 0.15% IN NS 100 ML EPIDURAL (ARMC)
EPIDURAL | Status: DC | PRN
  Administered 2019-01-11: 12 mL/h via EPIDURAL

## 2019-01-11 MED ORDER — LACTATED RINGERS IV BOLUS
1000.0000 mL | Freq: Once | INTRAVENOUS | Status: AC
  Administered 2019-01-11: 07:00:00 1000 mL via INTRAVENOUS

## 2019-01-11 MED ORDER — BENZOCAINE-MENTHOL 20-0.5 % EX AERO
1.0000 "application " | INHALATION_SPRAY | CUTANEOUS | Status: DC | PRN
  Administered 2019-01-11: 1 via TOPICAL
  Filled 2019-01-11: qty 56

## 2019-01-11 MED ORDER — MISOPROSTOL 200 MCG PO TABS
ORAL_TABLET | ORAL | Status: AC
  Filled 2019-01-11: qty 4

## 2019-01-11 MED ORDER — LIDOCAINE HCL (PF) 1 % IJ SOLN
INTRAMUSCULAR | Status: DC | PRN
  Administered 2019-01-11: 3 mL via SUBCUTANEOUS

## 2019-01-11 MED ORDER — LIDOCAINE-EPINEPHRINE (PF) 1.5 %-1:200000 IJ SOLN
INTRAMUSCULAR | Status: DC | PRN
  Administered 2019-01-11: 3 mL via PERINEURAL

## 2019-01-11 MED ORDER — OXYCODONE-ACETAMINOPHEN 5-325 MG PO TABS: 1.0000 | ORAL_TABLET | ORAL | Status: DC | PRN

## 2019-01-11 MED ORDER — ACETAMINOPHEN 325 MG PO TABS: 650.0000 mg | ORAL_TABLET | ORAL | Status: DC | PRN

## 2019-01-11 MED ORDER — LIDOCAINE HCL (PF) 1 % IJ SOLN
INTRAMUSCULAR | Status: AC
  Filled 2019-01-11: qty 30

## 2019-01-11 MED ORDER — BUPIVACAINE HCL (PF) 0.25 % IJ SOLN
INTRAMUSCULAR | Status: DC | PRN
  Administered 2019-01-11 (×2): 4 mL via EPIDURAL

## 2019-01-11 MED ORDER — TETANUS-DIPHTH-ACELL PERTUSSIS 5-2.5-18.5 LF-MCG/0.5 IM SUSP
0.5000 mL | Freq: Once | INTRAMUSCULAR | Status: DC
  Filled 2019-01-11: qty 0.5

## 2019-01-11 MED ORDER — ACETAMINOPHEN 325 MG PO TABS
650.0000 mg | ORAL_TABLET | ORAL | Status: DC | PRN
  Filled 2019-01-11: qty 2

## 2019-01-11 MED ORDER — SIMETHICONE 80 MG PO CHEW: 80.0000 mg | CHEWABLE_TABLET | ORAL | Status: DC | PRN

## 2019-01-11 NOTE — H&P (Signed)
History and Physical   HPI  Emily Farley is a 23 y.o. G2P1001 at 5589w4d Estimated Date of Delivery: 01/07/19 who is being admitted for induction for postdates and low amniotic fluid volume.  She has a favorable cervix.   OB History  OB History  Gravida Para Term Preterm AB Living  2 1 1  0 0 1  SAB TAB Ectopic Multiple Live Births  0 0 0 0 1    # Outcome Date GA Lbr Len/2nd Weight Sex Delivery Anes PTL Lv  2 Current           1 Term 2017 4776w5d  4026 g M Vag-Spont   LIV    Obstetric Comments  H/o episiotomy in 1st labor    PROBLEM LIST  Pregnancy complications or risks: Patient Active Problem List   Diagnosis Date Noted  . Post-dates pregnancy 01/11/2019        History of macrosomia  Prenatal labs and studies: ABO, Rh: --/--/O POS (04/24 0727) Antibody: NEG (04/24 0727) Rubella: Immune (09/13 0000) RPR: Non Reactive (01/30 1053)  HBsAg: Negative (09/13 0000)  HIV: Non-reactive (09/13 0000)  ZOX:WRUEAVWUGBS:Negative (03/26 0000)   Past Medical History:  Diagnosis Date  . Headache      Past Surgical History:  Procedure Laterality Date  . APPENDECTOMY    . no surgery history       Medications    Current Discharge Medication List    CONTINUE these medications which have NOT CHANGED   Details  Prenatal Vit-Fe Fumarate-FA (PRENATAL MULTIVITAMIN) TABS tablet Take 1 tablet by mouth daily at 12 noon.         Allergies  Penicillins  Review of Systems  Pertinent items are noted in HPI.  Physical Exam  BP 102/73   Pulse 90   Temp (!) 96.6 F (35.9 C) (Axillary)   Resp 16   Ht 5\' 3"  (1.6 m)   Wt 70.8 kg   LMP 04/02/2018   BMI 27.63 kg/m   Lungs:  CTA B Cardio: RRR without M/R/G Abd: Soft, gravid, NT Presentation: cephalic EXT: No C/C/ 1+ Edema DTRs: 2+ B CERVIX: Dilation: 3 Effacement (%): 60 Cervical Position: Middle Station: -3 Presentation: Vertex Exam by:: Rhylan Kagel MD AROM performed- no fluid noted. 50 mcg of misoprostol placed  See  Prenatal records for more detailed PE.     FHR:  Variability: Good {> 6 bpm)  Toco: Uterine Contractions: None   Test Results  Results for orders placed or performed during the hospital encounter of 01/11/19 (from the past 24 hour(s))  CBC     Status: Abnormal   Collection Time: 01/11/19  7:27 AM  Result Value Ref Range   WBC 11.1 (H) 4.0 - 10.5 K/uL   RBC 4.31 3.87 - 5.11 MIL/uL   Hemoglobin 12.8 12.0 - 15.0 g/dL   HCT 98.137.5 19.136.0 - 47.846.0 %   MCV 87.0 80.0 - 100.0 fL   MCH 29.7 26.0 - 34.0 pg   MCHC 34.1 30.0 - 36.0 g/dL   RDW 29.513.4 62.111.5 - 30.815.5 %   Platelets 201 150 - 400 K/uL   nRBC 0.0 0.0 - 0.2 %  Type and screen     Status: None   Collection Time: 01/11/19  7:27 AM  Result Value Ref Range   ABO/RH(D) O POS    Antibody Screen NEG    Sample Expiration      01/14/2019 Performed at [facility], [address] [place], [place] [address]  Assessment   G2P1001 at [redacted]w[redacted]d Estimated Date of Delivery: 01/07/19  The fetus is reassuring.    Patient Active Problem List   Diagnosis Date Noted  . Post-dates pregnancy 01/11/2019    Plan  1. Admit to L&D :    2. EFM: -- Category 1 3. Epidural if desired.  Stadol for IV pain until epidural requested. 4. Admission labs   Elonda Husky, M.D. 01/11/2019 8:46 AM

## 2019-01-11 NOTE — Lactation Note (Signed)
This note was copied from a baby's chart. Lactation Consultation Note  Patient Name: Emily Farley [id number] Date: 01/11/2019 Reason for consult: Initial assessment   Maternal Data Formula Feeding for Exclusion: No Does the patient have breastfeeding experience prior to this delivery?: Yes breastfed 1 yr oldl for 18 mths  Feeding Feeding Type: Breast Fed Baby latched easily to breast, nursing vigorously without pain to mom  LATCH Score Latch: Grasps breast easily, tongue down, lips flanged, rhythmical sucking.  Audible Swallowing: A few with stimulation  Type of Nipple: Everted at rest and after stimulation  Comfort (Breast/Nipple): Soft / non-tender  Hold (Positioning): Assistance needed to correctly position infant at breast and maintain latch.  LATCH Score: 8  Interventions Interventions: Breast feeding basics reviewed;Assisted with latch;Skin to skin;Support pillows  Lactation Tools Discussed/Used     Consult Status Consult Status: Follow-up Date: 01/12/19 Follow-up type: In-patient    Dyann Kief 01/11/2019, 3:05 PM

## 2019-01-11 NOTE — Anesthesia Procedure Notes (Signed)
Epidural Patient location during procedure: OB Start time: 01/11/2019 12:14 PM End time: 01/11/2019 12:23 PM  Staffing Anesthesiologist: Lenard Simmer, MD Resident/CRNA: Irving Burton, CRNA Performed: resident/CRNA   Preanesthetic Checklist Completed: patient identified, site marked, surgical consent, pre-op evaluation, IV checked, risks and benefits discussed and monitors and equipment checked  Epidural Patient position: sitting Prep: ChloraPrep and site prepped and draped Patient monitoring: heart rate, continuous pulse ox and blood pressure Approach: midline Location: L3-L4 Injection technique: LOR saline  Needle:  Needle type: Tuohy  Needle gauge: 17 G Needle length: 9 cm Needle insertion depth: 5.5 cm Catheter type: closed end flexible Catheter size: 19 Gauge Catheter at skin depth: 10 cm Test dose: negative and 1.5% lidocaine with Epi 1:200 K  Assessment Events: blood not aspirated, injection not painful, no injection resistance, negative IV test and no paresthesia  Additional Notes Reason for block:procedure for pain

## 2019-01-11 NOTE — Anesthesia Preprocedure Evaluation (Signed)
Anesthesia Evaluation  Patient identified by MRN, date of birth, ID band Patient awake    Reviewed: Allergy & Precautions, H&P , NPO status , Patient's Chart, lab work & pertinent test results  History of Anesthesia Complications Negative for: history of anesthetic complications  Airway Mallampati: II       Dental no notable dental hx.    Pulmonary neg pulmonary ROS,    Pulmonary exam normal        Cardiovascular negative cardio ROS Normal cardiovascular exam     Neuro/Psych  Headaches,    GI/Hepatic   Endo/Other    Renal/GU      Musculoskeletal   Abdominal   Peds  Hematology   Anesthesia Other Findings   Reproductive/Obstetrics (+) Pregnancy                             Anesthesia Physical Anesthesia Plan  ASA: II  Anesthesia Plan: Epidural   Post-op Pain Management:    Induction:   PONV Risk Score and Plan:   Airway Management Planned:   Additional Equipment:   Intra-op Plan:   Post-operative Plan:   Informed Consent: I have reviewed the patients History and Physical, chart, labs and discussed the procedure including the risks, benefits and alternatives for the proposed anesthesia with the patient or authorized representative who has indicated his/her understanding and acceptance.       Plan Discussed with: Anesthesiologist  Anesthesia Plan Comments:         Anesthesia Quick Evaluation

## 2019-01-12 LAB — RPR: RPR Ser Ql: NONREACTIVE

## 2019-01-12 MED ORDER — DIBUCAINE (PERIANAL) 1 % EX OINT
TOPICAL_OINTMENT | CUTANEOUS | Status: DC | PRN
  Filled 2019-01-12: qty 28

## 2019-01-12 MED ORDER — WITCH HAZEL-GLYCERIN EX PADS
MEDICATED_PAD | CUTANEOUS | Status: DC | PRN
  Filled 2019-01-12: qty 100

## 2019-01-12 NOTE — Discharge Summary (Signed)
                              Discharge Summary  Date of Admission: 01/11/2019  Date of Discharge: 01/12/2019  Admitting Diagnosis: Induction of labor at [redacted]w[redacted]d  Mode of Delivery: normal spontaneous vaginal delivery                 Discharge Diagnosis: No other diagnosis   Intrapartum Procedures: Atificial rupture of membranes, epidural and episiotomy 2nd   Post partum procedures:   Complications: none                      Discharge Day SOAP Note:  Progress Note - Vaginal Delivery  Emily Farley is a 23 y.o. G2P2002 now PP day 1 s/p Vaginal, Spontaneous . Delivery was uncomplicated  Subjective  The patient has the following complaints: has no unusual complaints  Pain is controlled with current medications.   Patient is urinating without difficulty.  She is ambulating well.    Objective  Vital signs: BP 104/73 (BP Location: Right Arm)   Pulse 80   Temp 98.4 F (36.9 C) (Oral)   Resp 18   Ht 5\' 3"  (1.6 m)   Wt 70.8 kg   LMP 04/02/2018   SpO2 99%   Breastfeeding Unknown   BMI 27.63 kg/m   Physical Exam: Gen: NAD Fundus Fundal Tone: Firm  Lochia Amount: Small  Perineum Appearance: Intact, Approximated     Data Review Labs: CBC Latest Ref Rng & Units 01/11/2019 10/18/2018 06/29/2018  WBC 4.0 - 10.5 K/uL 11.1(H) 13.8(H) -  Hemoglobin 12.0 - 15.0 g/dL 17.7 93.9 03.0  Hematocrit 36.0 - 46.0 % 37.5 37.9 41  Platelets 150 - 400 K/uL 201 273 -   O POS  Assessment/Plan  Active Problems:   Post-dates pregnancy    Plan for discharge today.   Discharge Instructions: Per After Visit Summary. Activity: Advance as tolerated. Pelvic rest for 6 weeks.  Also refer to After Visit Summary Diet: Regular Medications: Allergies as of 01/12/2019      Reactions   Penicillins Hives      Medication List    STOP taking these medications   prenatal multivitamin Tabs tablet      Outpatient follow up:  Follow-up Information    Linzie Collin, MD Follow up in 6  week(s).   Specialties:  Obstetrics and Gynecology, Radiology Contact information: [address] [place] [address] [phone]          Postpartum contraception: Will discuss at first office visit post-partum  Discharged Condition: good  Discharged to: home  Newborn Data: Disposition:home with mother  Apgars: APGAR (1 MIN): 8   APGAR (5 MINS): 9   APGAR (10 MINS):    Baby Feeding: Breast    Elonda Husky, M.D. 01/12/2019 10:17 AM

## 2019-01-12 NOTE — Anesthesia Postprocedure Evaluation (Signed)
Anesthesia Post Note  Patient: Emily Farley  Procedure(s) Performed: AN AD HOC LABOR EPIDURAL  Patient location during evaluation: Mother Baby Anesthesia Type: Epidural Level of consciousness: awake and alert and oriented Pain management: pain level controlled Vital Signs Assessment: post-procedure vital signs reviewed and stable Respiratory status: spontaneous breathing Cardiovascular status: blood pressure returned to baseline Postop Assessment: no headache and no backache Anesthetic complications: no Comments: Patient doing well     Last Vitals:  Vitals:   01/12/19 0418 01/12/19 0843  BP: 93/66 104/73  Pulse: 78 80  Resp: 20 18  Temp: 36.6 C 36.9 C  SpO2: 98% 99%    Last Pain:  Vitals:   01/12/19 1020  TempSrc:   PainSc: 0-No pain                 Kaito Schulenburg

## 2019-01-12 NOTE — Lactation Note (Signed)
This note was copied from a baby's chart. Lactation Consultation Note  Patient Name: Emily Farley MNOTR'R Date: 01/12/2019 Reason for consult: Follow-up assessment;Term  Observed mom breast feed independently with Caleb latching with minimal difficulty in football hold and beginning strong, rhythmic sucking and occasional swallowing.  Can easily hand express colostrum.  Mom breast fed 20 1/25 year old for 18 months.  Reviewed supply and demand, normal course of lactation and routine newborn feeding patterns.  Mom and baby to be discharged today.  Community lactation resources given and encouraged to call with any questons, concerns or assistance. Maternal Data Formula Feeding for Exclusion: No Has patient been taught Hand Expression?: Yes Does the patient have breastfeeding experience prior to this delivery?: Yes  Feeding Feeding Type: Breast Fed  LATCH Score Latch: Repeated attempts needed to sustain latch, nipple held in mouth throughout feeding, stimulation needed to elicit sucking reflex.  Audible Swallowing: A few with stimulation  Type of Nipple: Everted at rest and after stimulation  Comfort (Breast/Nipple): Soft / non-tender  Hold (Positioning): No assistance needed to correctly position infant at breast.  LATCH Score: 8  Interventions Interventions: Breast feeding basics reviewed;Support pillows;Coconut oil  Lactation Tools Discussed/Used WIC Program: No([employer])   Consult Status Consult Status: PRN Follow-up type: Call as needed    Emily Farley 01/12/2019, 5:52 PM

## 2019-01-12 NOTE — Progress Notes (Signed)
Reviewed D/C instructions with pt and family. Pt verbalized understanding of teaching. Discharged to home via W/C. Pt to schedule f/u appt.  

## 2019-02-21 ENCOUNTER — Ambulatory Visit (INDEPENDENT_AMBULATORY_CARE_PROVIDER_SITE_OTHER): Payer: Managed Care, Other (non HMO) | Admitting: Obstetrics and Gynecology

## 2019-02-21 ENCOUNTER — Other Ambulatory Visit: Payer: Self-pay

## 2019-02-21 ENCOUNTER — Encounter: Payer: Self-pay | Admitting: Obstetrics and Gynecology

## 2019-02-21 NOTE — Progress Notes (Signed)
Patient comes in today for PPV . She had bleed for 4 weeks and stopped. She then started bleeding again for the last two weeks.

## 2019-02-21 NOTE — Progress Notes (Signed)
HPI:      [name] is a 23 y.o. [id number] who LMP was No LMP recorded. (Menstrual status: Lactating).  Subjective:   She presents today for her postpartum visit.  She continues to breast-feed full-time.  She says that she bled for approximately 3 weeks and then stop for 2 weeks and now she is bleeding again. She has not resumed intercourse-plans to use condoms for birth control-she did this successfully last time. She states that she has resumed her normal activities and feels much better after this delivery than her previous delivery.    Hx: The following portions of the patient's history were reviewed and updated as appropriate:             She  has a past medical history of Headache. She does not have any pertinent problems on file. She  has a past surgical history that includes no surgery history and Appendectomy. Her family history includes Healthy in her father; Hypertension in her mother. She  reports that she has never smoked. She has never used smokeless tobacco. She reports that she does not drink alcohol or use drugs. She currently has no medications in their medication list. She is allergic to penicillins.       Review of Systems:  Review of Systems  Constitutional: Denied constitutional symptoms, night sweats, recent illness, fatigue, fever, insomnia and weight loss.  Eyes: Denied eye symptoms, eye pain, photophobia, vision change and visual disturbance.  Ears/Nose/Throat/Neck: Denied ear, nose, throat or neck symptoms, hearing loss, nasal discharge, sinus congestion and sore throat.  Cardiovascular: Denied cardiovascular symptoms, arrhythmia, chest pain/pressure, edema, exercise intolerance, orthopnea and palpitations.  Respiratory: Denied pulmonary symptoms, asthma, pleuritic pain, productive sputum, cough, dyspnea and wheezing.  Gastrointestinal: Denied, gastro-esophageal reflux, melena, nausea and vomiting.  Genitourinary: Denied genitourinary symptoms including  symptomatic vaginal discharge, pelvic relaxation issues, and urinary complaints.  Musculoskeletal: Denied musculoskeletal symptoms, stiffness, swelling, muscle weakness and myalgia.  Dermatologic: Denied dermatology symptoms, rash and scar.  Neurologic: Denied neurology symptoms, dizziness, headache, neck pain and syncope.  Psychiatric: Denied psychiatric symptoms, anxiety and depression.  Endocrine: Denied endocrine symptoms including hot flashes and night sweats.   Meds:   No current outpatient medications on file prior to visit.   No current facility-administered medications on file prior to visit.     Objective:     Vitals:   02/21/19 1005  BP: 104/61  Pulse: 89              Patient declines examination today because of her vaginal bleeding.  Assessment:    [id number] Patient Active Problem List   Diagnosis Date Noted  . Post-dates pregnancy 01/11/2019     1. Postpartum care and examination immediately after delivery     Patient doing well postpartum.   Plan:            1.  Expectant management of bleeding- I believe that this will stop in the next week or two.  I have informed her that if she continues to bleed we could put her on some hormones for a short time to give her a break from her bleeding.  Must keep in mind that she is breast-feeding.  2.  Patient may resume sexual activity and exercise.  3.  Condoms for birth control Orders No orders of the defined types were placed in this encounter.   No orders of the defined types were placed in this encounter.     F/U  Return in about  3 months (around 05/24/2019) for Annual Physical.  Emily Farley, M.D. 02/21/2019 10:39 AM

## 2019-05-28 ENCOUNTER — Ambulatory Visit (INDEPENDENT_AMBULATORY_CARE_PROVIDER_SITE_OTHER): Payer: Managed Care, Other (non HMO) | Admitting: Obstetrics and Gynecology

## 2019-05-28 ENCOUNTER — Other Ambulatory Visit: Payer: Self-pay

## 2019-05-28 ENCOUNTER — Encounter: Payer: Self-pay | Admitting: Obstetrics and Gynecology

## 2019-05-28 VITALS — BP 114/75 | HR 54 | Ht 63.0 in | Wt 132.6 lb

## 2019-05-28 DIAGNOSIS — Z01419 Encounter for gynecological examination (general) (routine) without abnormal findings: Secondary | ICD-10-CM | POA: Diagnosis not present

## 2019-05-28 NOTE — Progress Notes (Signed)
HPI:      [name] is a 23 y.o. [phone] who LMP was No LMP recorded. (Menstrual status: Lactating).  Subjective:   She presents today for her annual examination.  She is using condoms for birth control.  She continues to breast-feed full-time.   She has resumed exercising-running 2 miles per day.  Also does crafting work.    She feels like there is something occasionally bulging from the vagina.  It causes her no problems she is just aware that is there.    Hx: The following portions of the patient's history were reviewed and updated as appropriate:             She  has a past medical history of Headache. She does not have any pertinent problems on file. She  has a past surgical history that includes no surgery history and Appendectomy. Her family history includes Healthy in her father; Hypertension in her mother. She  reports that she has never smoked. She has never used smokeless tobacco. She reports that she does not drink alcohol or use drugs. She currently has no medications in their medication list. She is allergic to penicillins.       Review of Systems:  Review of Systems  Constitutional: Denied constitutional symptoms, night sweats, recent illness, fatigue, fever, insomnia and weight loss.  Eyes: Denied eye symptoms, eye pain, photophobia, vision change and visual disturbance.  Ears/Nose/Throat/Neck: Denied ear, nose, throat or neck symptoms, hearing loss, nasal discharge, sinus congestion and sore throat.  Cardiovascular: Denied cardiovascular symptoms, arrhythmia, chest pain/pressure, edema, exercise intolerance, orthopnea and palpitations.  Respiratory: Denied pulmonary symptoms, asthma, pleuritic pain, productive sputum, cough, dyspnea and wheezing.  Gastrointestinal: Denied, gastro-esophageal reflux, melena, nausea and vomiting.  Genitourinary: See HPI for additional information.  Musculoskeletal: Denied musculoskeletal symptoms, stiffness, swelling, muscle  weakness and myalgia.  Dermatologic: Denied dermatology symptoms, rash and scar.  Neurologic: Denied neurology symptoms, dizziness, headache, neck pain and syncope.  Psychiatric: Denied psychiatric symptoms, anxiety and depression.  Endocrine: Denied endocrine symptoms including hot flashes and night sweats.   Meds:   No current outpatient medications on file prior to visit.   No current facility-administered medications on file prior to visit.     Objective:     Vitals:   05/28/19 0906  BP: 114/75  Pulse: (!) 54              Physical examination General NAD, Conversant  HEENT Atraumatic; Op clear with mmm.  Normo-cephalic. Pupils reactive. Anicteric sclerae  Thyroid/Neck Smooth without nodularity or enlargement. Normal ROM.  Neck Supple.  Skin No rashes, lesions or ulceration. Normal palpated skin turgor. No nodularity.  Breasts: No masses or discharge.  Symmetric.  No axillary adenopathy.  Lungs: Clear to auscultation.No rales or wheezes. Normal Respiratory effort, no retractions.  Heart: NSR.  No murmurs or rubs appreciated. No periferal edema  Abdomen: Soft.  Non-tender.  No masses.  No HSM. No hernia  Extremities: Moves all appropriately.  Normal ROM for age. No lymphadenopathy.  Neuro: Oriented to PPT.  Normal mood. Normal affect.     Pelvic:   Vulva: Normal appearance.  No lesions.  Vagina: No lesions or abnormalities noted.  Support:  Support much better than I anticipated based on her complaints noted above.  Second-degree rectocele first-degree cystocele-at most.  Urethra No masses tenderness or scarring.  Meatus Normal size without lesions or prolapse.  Cervix: Normal appearance.  No lesions.  Anus: Normal exam.  No lesions.  Perineum: Normal exam.  No lesions.        Bimanual   Uterus: Normal size.  Non-tender.  Mobile.  AV.  Adnexae: No masses.  Non-tender to palpation.  Cul-de-sac: Negative for abnormality.      Assessment:    [id number] Patient Active  Problem List   Diagnosis Date Noted  . Post-dates pregnancy 01/11/2019     1. Well woman exam with routine gynecological exam        Plan:            1.  Basic Screening Recommendations The basic screening recommendations for asymptomatic women were discussed with the patient during her visit.  The age-appropriate recommendations were discussed with her and the rational for the tests reviewed.  When I am informed by the patient that another primary care physician has previously obtained the age-appropriate tests and they are up-to-date, only outstanding tests are ordered and referrals given as necessary.  Abnormal results of tests will be discussed with her when all of her results are completed.  Orders No orders of the defined types were placed in this encounter.   No orders of the defined types were placed in this encounter.       F/U  Return in about 1 year (around 05/27/2020).  Elonda Huskyavid J. Andi Mahaffy, M.D. 05/28/2019 9:36 AM

## 2019-05-28 NOTE — Progress Notes (Signed)
Patient comes in today for annual exam. Patient stated that she had PAP 12/2016 that was normal in [place].

## 2020-04-29 ENCOUNTER — Ambulatory Visit (INDEPENDENT_AMBULATORY_CARE_PROVIDER_SITE_OTHER): Payer: Managed Care, Other (non HMO) | Admitting: Obstetrics and Gynecology

## 2020-04-29 ENCOUNTER — Encounter: Payer: Self-pay | Admitting: Obstetrics and Gynecology

## 2020-04-29 VITALS — BP 115/80 | HR 72 | Ht 63.0 in | Wt 127.9 lb

## 2020-04-29 DIAGNOSIS — N912 Amenorrhea, unspecified: Secondary | ICD-10-CM

## 2020-04-29 NOTE — Progress Notes (Signed)
HPI:      [name] is a 24 y.o. [id number] who LMP was Patient's last menstrual period was 03/16/2020.  Subjective:   She presents today for pregnancy confirmation.  She is approximately 6 weeks estimated gestational age based on LMP.  She has occasional nausea but no vomiting.  She is taking prenatal vitamins.  Her 2 prior pregnancies were uncomplicated vaginal births.    Hx: The following portions of the patient's history were reviewed and updated as appropriate:             She  has a past medical history of Headache. She does not have any pertinent problems on file. She  has a past surgical history that includes no surgery history and Appendectomy. Her family history includes Healthy in her father; Hypertension in her mother. She  reports that she has never smoked. She has never used smokeless tobacco. She reports that she does not drink alcohol and does not use drugs. She has a current medication list which includes the following prescription(s): multivitamin-prenatal. She is allergic to penicillins.       Review of Systems:  Review of Systems  Constitutional: Denied constitutional symptoms, night sweats, recent illness, fatigue, fever, insomnia and weight loss.  Eyes: Denied eye symptoms, eye pain, photophobia, vision change and visual disturbance.  Ears/Nose/Throat/Neck: Denied ear, nose, throat or neck symptoms, hearing loss, nasal discharge, sinus congestion and sore throat.  Cardiovascular: Denied cardiovascular symptoms, arrhythmia, chest pain/pressure, edema, exercise intolerance, orthopnea and palpitations.  Respiratory: Denied pulmonary symptoms, asthma, pleuritic pain, productive sputum, cough, dyspnea and wheezing.  Gastrointestinal: Denied, gastro-esophageal reflux, melena, nausea and vomiting.  Genitourinary: Denied genitourinary symptoms including symptomatic vaginal discharge, pelvic relaxation issues, and urinary complaints.  Musculoskeletal: Denied  musculoskeletal symptoms, stiffness, swelling, muscle weakness and myalgia.  Dermatologic: Denied dermatology symptoms, rash and scar.  Neurologic: Denied neurology symptoms, dizziness, headache, neck pain and syncope.  Psychiatric: Denied psychiatric symptoms, anxiety and depression.  Endocrine: Denied endocrine symptoms including hot flashes and night sweats.   Meds:   Current Outpatient Medications on File Prior to Visit  Medication Sig Dispense Refill  . Prenatal Vit-Fe Fumarate-FA (MULTIVITAMIN-PRENATAL) 27-0.8 MG TABS tablet Take 1 tablet by mouth daily at 12 noon.     No current facility-administered medications on file prior to visit.    Objective:     Vitals:   04/29/20 1536  BP: 115/80  Pulse: 72              Urine pregnancy test positive  Assessment:    G3P2002 Patient Active Problem List   Diagnosis Date Noted  . Post-dates pregnancy 01/11/2019     1. Amenorrhea     Patient very early in the first trimester.  Approximately 6 weeks estimated gestational age.  Previously uncomplicated pregnancies.  Occasional nausea   Plan:            Prenatal Plan 1.  The patient was given prenatal literature. 2.  She was continued on prenatal vitamins. 3.  A prenatal lab panel was ordered or drawn. 4.  An ultrasound was ordered to better determine an EDC. 5.  A nurse visit was scheduled. 6.  Genetic testing and testing for other inheritable conditions discussed in detail. She will decide in the future whether to have these labs performed. 7.  A general overview of pregnancy testing, visit schedule, ultrasound schedule, and prenatal care was discussed. 8.  COVID and its risks associated with pregnancy, prevention by limiting exposure and use  of masks, as well as the risks and benefits of vaccination during pregnancy were discussed in detail.  [facility] policy regarding office and hospital visitation and testing was explained. 9.  Benefits of breast-feeding discussed in  detail including both maternal and infant benefits. Ready Set Baby website discussed. 10.  Literature regarding nausea vomiting of pregnancy given.   Orders No orders of the defined types were placed in this encounter.   No orders of the defined types were placed in this encounter.     F/U  Return in about 6 weeks (around 06/10/2020). I spent 22 minutes involved in the care of this patient preparing to see the patient by obtaining and reviewing her medical history (including labs, imaging tests and prior procedures), documenting clinical information in the electronic health record (EHR), counseling and coordinating care plans, writing and sending prescriptions, ordering tests or procedures and directly communicating with the patient by discussing pertinent items from her history and physical exam as well as detailing my assessment and plan as noted above so that she has an informed understanding.  All of her questions were answered.  Elonda Husky, M.D. 04/29/2020 4:09 PM

## 2020-05-05 ENCOUNTER — Other Ambulatory Visit: Payer: Self-pay | Admitting: Obstetrics and Gynecology

## 2020-05-05 DIAGNOSIS — Z789 Other specified health status: Secondary | ICD-10-CM

## 2020-05-06 ENCOUNTER — Ambulatory Visit (INDEPENDENT_AMBULATORY_CARE_PROVIDER_SITE_OTHER): Payer: Managed Care, Other (non HMO)

## 2020-05-06 ENCOUNTER — Other Ambulatory Visit: Payer: Self-pay

## 2020-05-06 DIAGNOSIS — Z3A01 Less than 8 weeks gestation of pregnancy: Secondary | ICD-10-CM | POA: Diagnosis not present

## 2020-05-06 DIAGNOSIS — Z789 Other specified health status: Secondary | ICD-10-CM | POA: Diagnosis not present

## 2020-05-21 ENCOUNTER — Other Ambulatory Visit: Payer: Self-pay

## 2020-05-21 ENCOUNTER — Ambulatory Visit: Payer: Managed Care, Other (non HMO)

## 2020-05-21 VITALS — BP 105/69 | HR 68 | Wt 128.5 lb

## 2020-05-21 DIAGNOSIS — Z3A08 8 weeks gestation of pregnancy: Secondary | ICD-10-CM

## 2020-05-21 NOTE — Progress Notes (Signed)
Emily Farley presents for NOB nurse interview visit. Pregnancy confirmation done 04/29/20 Dr Logan Bores.LMP 03/16/20. U/S 05/06/20- 6 weeks 3 days G-3 P 2 0 0 2. Pregnancy education material explained and given. 0 cats in the home. NOB labs ordered. HIV labs and Drug screen were  declined. PNV encouraged. Genetic screening options discussed. Genetic testing:Declined due to high deductible.  NOB physical scheduled for 06/09/20 with Dr. Logan Bores.  All questions answered.  FMLA paper explained and signed. Financial form reviewed and understood.

## 2020-05-22 LAB — URINALYSIS, ROUTINE W REFLEX MICROSCOPIC
Bilirubin, UA: NEGATIVE
Glucose, UA: NEGATIVE
Leukocytes,UA: NEGATIVE
Nitrite, UA: NEGATIVE
RBC, UA: NEGATIVE
Specific Gravity, UA: >=1.03 — AB (ref 1.005–1.030)
Urobilinogen, Ur: 0.2 mg/dL (ref 0.2–1.0)
pH, UA: 7 (ref 5.0–7.5)

## 2020-05-22 LAB — VARICELLA ZOSTER ANTIBODY, IGG: Varicella zoster IgG: >4000 index (ref >165)

## 2020-05-22 LAB — CBC WITH DIFFERENTIAL
Basophils Absolute: 0 10*3/uL (ref 0.0–0.2)
Basos: 0 %
EOS (ABSOLUTE): 0.1 10*3/uL (ref 0.0–0.4)
Eos: 1 %
Hematocrit: 40.5 % (ref 34.0–46.6)
Hemoglobin: 13.8 g/dL (ref 11.1–15.9)
Immature Grans (Abs): 0 10*3/uL (ref 0.0–0.1)
Immature Granulocytes: 0 %
Lymphocytes Absolute: 1.9 10*3/uL (ref 0.7–3.1)
Lymphs: 16 %
MCH: 30.9 pg (ref 26.6–33.0)
MCHC: 34.1 g/dL (ref 31.5–35.7)
MCV: 91 fL (ref 79–97)
Monocytes Absolute: 0.6 10*3/uL (ref 0.1–0.9)
Monocytes: 6 %
Neutrophils Absolute: 9 10*3/uL — ABNORMAL HIGH (ref 1.4–7.0)
Neutrophils: 77 %
RBC: 4.47 x10E6/uL (ref 3.77–5.28)
RDW: 12.8 % (ref 11.7–15.4)
WBC: 11.7 10*3/uL — ABNORMAL HIGH (ref 3.4–10.8)

## 2020-05-22 LAB — ABO AND RH: Rh Factor: POSITIVE

## 2020-05-22 LAB — RPR: RPR Ser Ql: NONREACTIVE

## 2020-05-22 LAB — HEPATITIS B SURFACE ANTIGEN: Hepatitis B Surface Ag: NEGATIVE

## 2020-05-22 LAB — RUBELLA SCREEN: Rubella Antibodies, IGG: 6.52 index (ref >0.99)

## 2020-05-22 LAB — ANTIBODY SCREEN: Antibody Screen: NEGATIVE

## 2020-05-23 LAB — URINE CULTURE: Organism ID, Bacteria: NO GROWTH

## 2020-05-23 LAB — GC/CHLAMYDIA PROBE AMP
Chlamydia trachomatis, NAA: NEGATIVE
Neisseria Gonorrhoeae by PCR: NEGATIVE

## 2020-05-28 ENCOUNTER — Encounter: Payer: Managed Care, Other (non HMO) | Admitting: Obstetrics and Gynecology

## 2020-05-29 ENCOUNTER — Encounter: Payer: Managed Care, Other (non HMO) | Admitting: Obstetrics and Gynecology

## 2020-06-09 ENCOUNTER — Other Ambulatory Visit: Payer: Self-pay

## 2020-06-09 ENCOUNTER — Ambulatory Visit (INDEPENDENT_AMBULATORY_CARE_PROVIDER_SITE_OTHER): Payer: Managed Care, Other (non HMO) | Admitting: Obstetrics and Gynecology

## 2020-06-09 ENCOUNTER — Encounter: Payer: Managed Care, Other (non HMO) | Admitting: Obstetrics and Gynecology

## 2020-06-09 ENCOUNTER — Encounter: Payer: Self-pay | Admitting: Obstetrics and Gynecology

## 2020-06-09 ENCOUNTER — Other Ambulatory Visit (HOSPITAL_COMMUNITY)
Admission: RE | Admit: 2020-06-09 | Discharge: 2020-06-09 | Disposition: A | Discharge status: Home / Self Care | Payer: Managed Care, Other (non HMO) | Source: Ambulatory Visit | Attending: Obstetrics and Gynecology | Admitting: Obstetrics and Gynecology

## 2020-06-09 VITALS — BP 104/73 | HR 89 | Wt 130.2 lb

## 2020-06-09 DIAGNOSIS — Z3A11 11 weeks gestation of pregnancy: Secondary | ICD-10-CM | POA: Diagnosis not present

## 2020-06-09 DIAGNOSIS — Z3491 Encounter for supervision of normal pregnancy, unspecified, first trimester: Secondary | ICD-10-CM | POA: Insufficient documentation

## 2020-06-09 LAB — POCT URINALYSIS DIPSTICK OB
Bilirubin, UA: NEGATIVE
Blood, UA: NEGATIVE
Glucose, UA: NEGATIVE
Ketones, UA: NEGATIVE
Leukocytes, UA: NEGATIVE
Nitrite, UA: NEGATIVE
Odor: NEGATIVE
POC,PROTEIN,UA: NEGATIVE
Spec Grav, UA: 1.01 (ref 1.010–1.025)
Urobilinogen, UA: 0.2 E.U./dL
pH, UA: 6 (ref 5.0–8.0)

## 2020-06-09 NOTE — Progress Notes (Signed)
NOB- no complaints. Declines genetic testing.

## 2020-06-09 NOTE — Progress Notes (Signed)
NOB:   Taking B6 and Unisom with good results.  Taking vitamins as directed.  Patient has no complaints.  Declines genetic testing.  Quad screen next visit.  Physical examination General NAD, Conversant  HEENT Atraumatic; Op clear with mmm.  Normo-cephalic. Pupils reactive. Anicteric sclerae  Thyroid/Neck Smooth without nodularity or enlargement. Normal ROM.  Neck Supple.  Skin No rashes, lesions or ulceration. Normal palpated skin turgor. No nodularity.  Breasts: No masses or discharge.  Symmetric.  No axillary adenopathy.  Lungs: Clear to auscultation.No rales or wheezes. Normal Respiratory effort, no retractions.  Heart: NSR.  No murmurs or rubs appreciated. No periferal edema  Abdomen: Soft.  Non-tender.  No masses.  No HSM. No hernia  Extremities: Moves all appropriately.  Normal ROM for age. No lymphadenopathy.  Neuro: Oriented to PPT.  Normal mood. Normal affect.     Pelvic:   Vulva: Normal appearance.  No lesions.  Vagina: No lesions or abnormalities noted.  Support: Normal pelvic support.  Urethra No masses tenderness or scarring.  Meatus Normal size without lesions or prolapse.  Cervix: Normal appearance.  No lesions.  Anus: Normal exam.  No lesions.  Perineum: Normal exam.  No lesions.        Bimanual   Adnexae: No masses.  Non-tender to palpation.  Uterus: Enlarged. 12wks  Pos FHTs  Non-tender.  Mobile.  AV.  Adnexae: No masses.  Non-tender to palpation.  Cul-de-sac: Negative for abnormality.  Adnexae: No masses.  Non-tender to palpation.         Pelvimetry   Diagonal: Reached.  Spines: Average.  Sacrum: Concave.  Pubic Arch: Normal.

## 2020-06-09 NOTE — Addendum Note (Signed)
Addended by: Marchelle Folks on: 06/09/2020 12:12 PM   Modules accepted: Orders

## 2020-06-10 LAB — CYTOLOGY - PAP: Diagnosis: NEGATIVE

## 2020-06-17 NOTE — Telephone Encounter (Signed)
Called pt and went over her ob payment plan with her. I gave her the option to make a payment before he next appt or at her next appt. The pt verbally understood that she has 5 payments of $230.52.

## 2020-06-23 ENCOUNTER — Encounter: Payer: Managed Care, Other (non HMO) | Admitting: Obstetrics and Gynecology

## 2020-07-17 ENCOUNTER — Encounter: Payer: Self-pay | Admitting: Obstetrics and Gynecology

## 2020-07-17 ENCOUNTER — Other Ambulatory Visit: Payer: Managed Care, Other (non HMO)

## 2020-07-17 ENCOUNTER — Other Ambulatory Visit: Payer: Self-pay

## 2020-07-17 ENCOUNTER — Ambulatory Visit (INDEPENDENT_AMBULATORY_CARE_PROVIDER_SITE_OTHER): Payer: Managed Care, Other (non HMO) | Admitting: Obstetrics and Gynecology

## 2020-07-17 VITALS — BP 93/70 | HR 75 | Wt 133.7 lb

## 2020-07-17 DIAGNOSIS — Z3A16 16 weeks gestation of pregnancy: Secondary | ICD-10-CM

## 2020-07-17 DIAGNOSIS — Z3402 Encounter for supervision of normal first pregnancy, second trimester: Secondary | ICD-10-CM

## 2020-07-17 DIAGNOSIS — Z23 Encounter for immunization: Secondary | ICD-10-CM | POA: Diagnosis not present

## 2020-07-17 LAB — POCT URINALYSIS DIPSTICK OB
Bilirubin, UA: NEGATIVE
Blood, UA: NEGATIVE
Glucose, UA: NEGATIVE
Ketones, UA: NEGATIVE
Leukocytes, UA: NEGATIVE
Nitrite, UA: NEGATIVE
POC,PROTEIN,UA: NEGATIVE
Spec Grav, UA: 1.02 (ref 1.010–1.025)
Urobilinogen, UA: 0.2 E.U./dL
pH, UA: 7 (ref 5.0–8.0)

## 2020-07-17 NOTE — Progress Notes (Signed)
ROB-Pt present for routine prenatal care. Quad and flu vaccine completed today. Pt stated that she was doing well no problems.

## 2020-07-17 NOTE — Patient Instructions (Signed)
Second Trimester of Pregnancy  The second trimester is from week 14 through week 27 (month 4 through 6). This is often the time in pregnancy that you feel your best. Often times, morning sickness has lessened or quit. You may have more energy, and you may get hungry more often. Your unborn baby is growing rapidly. At the end of the sixth month, he or she is about 9 inches long and weighs about 1 pounds. You will likely feel the baby move between 18 and 20 weeks of pregnancy. Follow these instructions at home: Medicines  Take over-the-counter and prescription medicines only as told by your doctor. Some medicines are safe and some medicines are not safe during pregnancy.  Take a prenatal vitamin that contains at least 600 micrograms (mcg) of folic acid.  If you have trouble pooping (constipation), take medicine that will make your stool soft (stool softener) if your doctor approves. Eating and drinking   Eat regular, healthy meals.  Avoid raw meat and uncooked cheese.  If you get low calcium from the food you eat, talk to your doctor about taking a daily calcium supplement.  Avoid foods that are high in fat and sugars, such as fried and sweet foods.  If you feel sick to your stomach (nauseous) or throw up (vomit): ? Eat 4 or 5 small meals a day instead of 3 large meals. ? Try eating a few soda crackers. ? Drink liquids between meals instead of during meals.  To prevent constipation: ? Eat foods that are high in fiber, like fresh fruits and vegetables, whole grains, and beans. ? Drink enough fluids to keep your pee (urine) clear or pale yellow. Activity  Exercise only as told by your doctor. Stop exercising if you start to have cramps.  Do not exercise if it is too hot, too humid, or if you are in a place of great height (high altitude).  Avoid heavy lifting.  Wear low-heeled shoes. Sit and stand up straight.  You can continue to have sex unless your doctor tells you not  to. Relieving pain and discomfort  Wear a good support bra if your breasts are tender.  Take warm water baths (sitz baths) to soothe pain or discomfort caused by hemorrhoids. Use hemorrhoid cream if your doctor approves.  Rest with your legs raised if you have leg cramps or low back pain.  If you develop puffy, bulging veins (varicose veins) in your legs: ? Wear support hose or compression stockings as told by your doctor. ? Raise (elevate) your feet for 15 minutes, 3-4 times a day. ? Limit salt in your food. Prenatal care  Write down your questions. Take them to your prenatal visits.  Keep all your prenatal visits as told by your doctor. This is important. Safety  Wear your seat belt when driving.  Make a list of emergency phone numbers, including numbers for family, friends, the hospital, and police and fire departments. General instructions  Ask your doctor about the right foods to eat or for help finding a counselor, if you need these services.  Ask your doctor about local prenatal classes. Begin classes before month 6 of your pregnancy.  Do not use hot tubs, steam rooms, or saunas.  Do not douche or use tampons or scented sanitary pads.  Do not cross your legs for long periods of time.  Visit your dentist if you have not done so. Use a soft toothbrush to brush your teeth. Floss gently.  Avoid all smoking, herbs,   and alcohol. Avoid drugs that are not approved by your doctor.  Do not use any products that contain nicotine or tobacco, such as cigarettes and e-cigarettes. If you need help quitting, ask your doctor.  Avoid cat litter boxes and soil used by cats. These carry germs that can cause birth defects in the baby and can cause a loss of your baby (miscarriage) or stillbirth. Contact a doctor if:  You have mild cramps or pressure in your lower belly.  You have pain when you pee (urinate).  You have bad smelling fluid coming from your vagina.  You continue to  feel sick to your stomach (nauseous), throw up (vomit), or have watery poop (diarrhea).  You have a nagging pain in your belly area.  You feel dizzy. Get help right away if:  You have a fever.  You are leaking fluid from your vagina.  You have spotting or bleeding from your vagina.  You have severe belly cramping or pain.  You lose or gain weight rapidly.  You have trouble catching your breath and have chest pain.  You notice sudden or extreme puffiness (swelling) of your face, hands, ankles, feet, or legs.  You have not felt the baby move in over an hour.  You have severe headaches that do not go away when you take medicine.  You have trouble seeing. Summary  The second trimester is from week 14 through week 27 (months 4 through 6). This is often the time in pregnancy that you feel your best.  To take care of yourself and your unborn baby, you will need to eat healthy meals, take medicines only if your doctor tells you to do so, and do activities that are safe for you and your baby.  Call your doctor if you get sick or if you notice anything unusual about your pregnancy. Also, call your doctor if you need help with the right food to eat, or if you want to know what activities are safe for you. This information is not intended to replace advice given to you by your health care provider. Make sure you discuss any questions you have with your health care provider. Document Revised: 12/28/2018 Document Reviewed: 10/11/2016 Elsevier Patient Education  [place]. Common Medications Safe in Pregnancy  Acne:      Constipation:  Benzoyl Peroxide     Colace  Clindamycin      Dulcolax Suppository  Topica Erythromycin     Fibercon  Salicylic Acid      Metamucil         Miralax AVOID:        Senakot   Accutane    Cough:  Retin-A       Cough Drops  Tetracycline      Phenergan w/ Codeine if Rx  Minocycline      Robitussin (Plain &  DM)  Antibiotics:     Crabs/Lice:  Ceclor       RID  Cephalosporins    AVOID:  E-Mycins      Kwell  Keflex  Macrobid/Macrodantin   Diarrhea:  Penicillin      Kao-Pectate  Zithromax      Imodium AD         PUSH FLUIDS AVOID:       Cipro     Fever:  Tetracycline      Tylenol (Regular or Extra  Minocycline       Strength)  Levaquin      Extra Strength-Do not  Exceed 8 tabs/24 hrs Caffeine:        <239m/day (equiv. To 1 cup of coffee or  approx. 3 12 oz sodas)         Gas: Cold/Hayfever:       Gas-X  Benadryl      Mylicon  Claritin       Phazyme  **Claritin-D        Chlor-Trimeton    Headaches:  Dimetapp      ASA-Free Excedrin  Drixoral-Non-Drowsy     Cold Compress  Mucinex (Guaifenasin)     Tylenol (Regular or Extra  Sudafed/Sudafed-12 Hour     Strength)  **Sudafed PE Pseudoephedrine   Tylenol Cold & Sinus     Vicks Vapor Rub  Zyrtec  **AVOID if Problems With Blood Pressure         Heartburn: Avoid lying down for at least 1 hour after meals  Aciphex      Maalox     Rash:  Milk of Magnesia     Benadryl    Mylanta       1% Hydrocortisone Cream  Pepcid  Pepcid Complete   Sleep Aids:  Prevacid      Ambien   Prilosec       Benadryl  Rolaids       Chamomile Tea  Tums (Limit 4/day)     Unisom         Tylenol PM         Warm milk-add vanilla or  Hemorrhoids:       Sugar for taste  Anusol/Anusol H.C.  (RX: Analapram 2.5%)  Sugar Substitutes:  Hydrocortisone OTC     Ok in moderation  Preparation H      Tucks        Vaseline lotion applied to tissue with wiping    Herpes:     Throat:  Acyclovir      Oragel  Famvir  Valtrex     Vaccines:         Flu Shot Leg Cramps:       *Gardasil  Benadryl      Hepatitis A         Hepatitis B Nasal Spray:       Pneumovax  Saline Nasal Spray     Polio Booster         Tetanus Nausea:       Tuberculosis test or PPD  Vitamin B6 25 mg TID   AVOID:    Dramamine      *Gardasil  Emetrol       Live Poliovirus  Ginger  Root 250 mg QID    MMR (measles, mumps &  High Complex Carbs @ Bedtime    rebella)  Sea Bands-Accupressure    Varicella (Chickenpox)  Unisom 1/2 tab TID     *No known complications           If received before Pain:         Known pregnancy;   Darvocet       Resume series after  Lortab        Delivery  Percocet    Yeast:   Tramadol      Femstat  Tylenol 3      Gyne-lotrimin  Ultram       Monistat  Vicodin           MISC:         All Sunscreens  Hair Coloring/highlights          Insect Repellant's          (Including DEET)         Mystic Tans Influenza (Flu) Vaccine (Inactivated or Recombinant): What You Need to Know 1. Why get vaccinated? Influenza vaccine can prevent influenza (flu). Flu is a contagious disease that spreads around the [place] every year, usually between October and May. Anyone can get the flu, but it is more dangerous for some people. Infants and young children, people 43 years of age and older, pregnant women, and people with certain health conditions or a weakened immune system are at greatest risk of flu complications. Pneumonia, bronchitis, sinus infections and ear infections are examples of flu-related complications. If you have a medical condition, such as heart disease, cancer or diabetes, flu can make it worse. Flu can cause fever and chills, sore throat, muscle aches, fatigue, cough, headache, and runny or stuffy nose. Some people may have vomiting and diarrhea, though this is more common in children than adults. Each year thousands of people in the [place] die from flu, and many more are hospitalized. Flu vaccine prevents millions of illnesses and flu-related visits to the doctor each year. 2. Influenza vaccine CDC recommends everyone 38 months of age and older get vaccinated every flu season. Children 6 months through 12 years of age may need 2 doses during a single flu season. Everyone else needs only 1 dose each flu season. It takes about 2  weeks for protection to develop after vaccination. There are many flu viruses, and they are always changing. Each year a new flu vaccine is made to protect against three or four viruses that are likely to cause disease in the upcoming flu season. Even when the vaccine doesn't exactly match these viruses, it may still provide some protection. Influenza vaccine does not cause flu. Influenza vaccine may be given at the same time as other vaccines. 3. Talk with your health care provider Tell your vaccine provider if the person getting the vaccine:  Has had an allergic reaction after a previous dose of influenza vaccine, or has any severe, life-threatening allergies.  Has ever had Guillain-Barr Syndrome (also called GBS). In some cases, your health care provider may decide to postpone influenza vaccination to a future visit. People with minor illnesses, such as a cold, may be vaccinated. People who are moderately or severely ill should usually wait until they recover before getting influenza vaccine. Your health care provider can give you more information. 4. Risks of a vaccine reaction  Soreness, redness, and swelling where shot is given, fever, muscle aches, and headache can happen after influenza vaccine.  There may be a very small increased risk of Guillain-Barr Syndrome (GBS) after inactivated influenza vaccine (the flu shot). Young children who get the flu shot along with pneumococcal vaccine (PCV13), and/or DTaP vaccine at the same time might be slightly more likely to have a seizure caused by fever. Tell your health care provider if a child who is getting flu vaccine has ever had a seizure. People sometimes faint after medical procedures, including vaccination. Tell your provider if you feel dizzy or have vision changes or ringing in the ears. As with any medicine, there is a very remote chance of a vaccine causing a severe allergic reaction, other serious injury, or death. 5. What if there  is a serious problem? An allergic reaction could occur after the vaccinated person leaves the clinic. If you see  signs of a severe allergic reaction (hives, swelling of the face and throat, difficulty breathing, a fast heartbeat, dizziness, or weakness), call 9-1-1 and get the person to the nearest hospital. For other signs that concern you, call your health care provider. Adverse reactions should be reported to the Vaccine Adverse Event Reporting System (VAERS). Your health care provider will usually file this report, or you can do it yourself. Visit the VAERS website at www.vaers.SamedayNews.es or call (437) 521-5044.VAERS is only for reporting reactions, and VAERS staff do not give medical advice. 6. The National Vaccine Injury Compensation Program The [employer] Vaccine Injury Compensation Program (VICP) is a federal program that was created to compensate people who may have been injured by certain vaccines. Visit the VICP website at GoldCloset.com.ee or call 9736251295 to learn about the program and about filing a claim. There is a time limit to file a claim for compensation. 7. How can I learn more?  Ask your healthcare provider.  Call your local or state health department.  Contact the Centers for Disease Control and Prevention (CDC): ? Call 915-406-4817 (1-800-CDC-INFO) or ? Visit CDC's https://gibson.com/ Vaccine Information Statement (Interim) Inactivated Influenza Vaccine (05/03/2018) This information is not intended to replace advice given to you by your health care provider. Make sure you discuss any questions you have with your health care provider. Document Revised: 12/25/2018 Document Reviewed: 05/07/2018 Elsevier Patient Education  [place].

## 2020-07-17 NOTE — Progress Notes (Addendum)
ROB: Doing well, no complaints.  Flu shot given today. Quad screen completed today. Review of chart notes h/o macrosomia in 1st pregnancy, to get A1c next visit. RTC in 4 weeks, for anatomy scan then.

## 2020-07-19 LAB — AFP TETRA
DIA Mom Value: 0.88
DIA Value (EIA): 155.02 pg/mL
DSR (By Age)    1 IN: 948
DSR (Second Trimester) 1 IN: 6162
Gestational Age: 16 WEEKS
MSAFP Mom: 0.71
MSAFP: 26.5 ng/mL
MSHCG Mom: 0.89
MSHCG: 41711 m[IU]/mL
Maternal Age At EDD: 26.6 yr
Osb Risk: 10000
T18 (By Age): 1:3695 {titer}
Test Results:: NEGATIVE
Weight: 130 [lb_av]
uE3 Mom: 1.1
uE3 Value: 1.09 ng/mL

## 2020-08-11 ENCOUNTER — Ambulatory Visit (INDEPENDENT_AMBULATORY_CARE_PROVIDER_SITE_OTHER): Payer: Managed Care, Other (non HMO) | Admitting: Obstetrics and Gynecology

## 2020-08-11 ENCOUNTER — Encounter: Payer: Self-pay | Admitting: Obstetrics and Gynecology

## 2020-08-11 ENCOUNTER — Ambulatory Visit (INDEPENDENT_AMBULATORY_CARE_PROVIDER_SITE_OTHER): Payer: Managed Care, Other (non HMO)

## 2020-08-11 ENCOUNTER — Other Ambulatory Visit: Payer: Self-pay

## 2020-08-11 VITALS — BP 111/77 | HR 76 | Wt 138.5 lb

## 2020-08-11 DIAGNOSIS — Z3402 Encounter for supervision of normal first pregnancy, second trimester: Secondary | ICD-10-CM

## 2020-08-11 DIAGNOSIS — O09299 Supervision of pregnancy with other poor reproductive or obstetric history, unspecified trimester: Secondary | ICD-10-CM

## 2020-08-11 DIAGNOSIS — Z3A20 20 weeks gestation of pregnancy: Secondary | ICD-10-CM

## 2020-08-11 LAB — POCT URINALYSIS DIPSTICK OB
Bilirubin, UA: NEGATIVE
Blood, UA: NEGATIVE
Glucose, UA: NEGATIVE
Ketones, UA: NEGATIVE
Leukocytes, UA: NEGATIVE
Nitrite, UA: NEGATIVE
POC,PROTEIN,UA: NEGATIVE
Spec Grav, UA: 1.015 (ref 1.010–1.025)
Urobilinogen, UA: 0.2 E.U./dL
pH, UA: 7 (ref 5.0–8.0)

## 2020-08-11 NOTE — Progress Notes (Signed)
ROB: FAS today-normal female.  Hemoglobin A1c drawn. (History of macrosomia).  Patient feeling daily fetal movement.

## 2020-08-12 LAB — HEMOGLOBIN A1C
Est. average glucose Bld gHb Est-mCnc: 80 mg/dL
Hgb A1c MFr Bld: 4.4 % — ABNORMAL LOW (ref 4.8–5.6)

## 2020-09-09 ENCOUNTER — Encounter: Payer: Self-pay | Admitting: Obstetrics and Gynecology

## 2020-09-09 ENCOUNTER — Other Ambulatory Visit: Payer: Self-pay

## 2020-09-09 ENCOUNTER — Ambulatory Visit (INDEPENDENT_AMBULATORY_CARE_PROVIDER_SITE_OTHER): Payer: Managed Care, Other (non HMO) | Admitting: Obstetrics and Gynecology

## 2020-09-09 VITALS — BP 95/65 | HR 91 | Wt 145.2 lb

## 2020-09-09 DIAGNOSIS — Z3402 Encounter for supervision of normal first pregnancy, second trimester: Secondary | ICD-10-CM

## 2020-09-09 DIAGNOSIS — Z3A24 24 weeks gestation of pregnancy: Secondary | ICD-10-CM

## 2020-09-09 LAB — POCT URINALYSIS DIPSTICK OB
Bilirubin, UA: NEGATIVE
Blood, UA: NEGATIVE
Glucose, UA: NEGATIVE
Ketones, UA: NEGATIVE
Leukocytes, UA: NEGATIVE
Nitrite, UA: NEGATIVE
POC,PROTEIN,UA: NEGATIVE
Spec Grav, UA: 1.02
Urobilinogen, UA: 0.2 U/dL
pH, UA: 6.5

## 2020-09-09 NOTE — Progress Notes (Signed)
ROB: Doing well, no issues. Desires to breastfeed.    The following were addressed during this visit:  Breastfeeding Education - Early initiation of breastfeeding    Comments: Keeps milk supply adequate, helps contract uterus and slow bleeding, and early milk is the perfect first food and is easy to digest.   - The importance of exclusive breastfeeding    Comments: Provides antibodies, Lower risk of breast and ovarian cancers, and type-2 diabetes,Helps your body recover, Reduced chance of SIDS.   - Frequent feeding to help assure optimal milk production    Comments: Making a full supply of milk requires frequent removal of milk from breasts, infant will eat 8-12 times in 24 hours, if separated from infant use breast massage, hand expression and/ or pumping to remove milk from breasts.   - Exclusive breastfeeding for the first 6 months    Comments: Builds a healthy milk supply and keeps it up, protects baby from sickness and disease, and breastmilk has everything your baby needs for the first 6 months.

## 2020-09-09 NOTE — Progress Notes (Signed)
ROB-Pt present for routine prenatal care. Pt stated that she was doing well no problems.  

## 2020-09-09 NOTE — Patient Instructions (Addendum)
WHAT OB PATIENTS CAN EXPECT   Confirmation of pregnancy and ultrasound ordered if medically indicated-[redacted] weeks gestation  New OB (NOB) intake with nurse and New OB (NOB) labs- [redacted] weeks gestation  New OB (NOB) physical examination with provider- 11/[redacted] weeks gestation  Flu vaccine-[redacted] weeks gestation  Anatomy scan-[redacted] weeks gestation  Glucose tolerance test, blood work to test for anemia, T-dap vaccine-[redacted] weeks gestation  Vaginal swabs/cultures-STD/Group B strep-[redacted] weeks gestation  Appointments every 4 weeks until 28 weeks  Every 2 weeks from 28 weeks until 36 weeks  Weekly visits from 36 weeks until delivery   Common Medications Safe in Pregnancy  Acne:      Constipation:  Benzoyl Peroxide     Colace  Clindamycin      Dulcolax Suppository  Topica Erythromycin     Fibercon  Salicylic Acid      Metamucil         Miralax AVOID:        Senakot   Accutane    Cough:  Retin-A       Cough Drops  Tetracycline      Phenergan w/ Codeine if Rx  Minocycline      Robitussin (Plain & DM)  Antibiotics:     Crabs/Lice:  Ceclor       RID  Cephalosporins    AVOID:  E-Mycins      Kwell  Keflex  Macrobid/Macrodantin   Diarrhea:  Penicillin      Kao-Pectate  Zithromax      Imodium AD         PUSH FLUIDS AVOID:       Cipro     Fever:  Tetracycline      Tylenol (Regular or Extra  Minocycline       Strength)  Levaquin      Extra Strength-Do not          Exceed 8 tabs/24 hrs Caffeine:        <29m/day (equiv. To 1 cup of coffee or  approx. 3 12 oz sodas)         Gas: Cold/Hayfever:       Gas-X  Benadryl      Mylicon  Claritin       Phazyme  **Claritin-D        Chlor-Trimeton    Headaches:  Dimetapp      ASA-Free Excedrin  Drixoral-Non-Drowsy     Cold Compress  Mucinex (Guaifenasin)     Tylenol (Regular or Extra  Sudafed/Sudafed-12 Hour     Strength)  **Sudafed PE Pseudoephedrine   Tylenol Cold & Sinus     Vicks Vapor Rub  Zyrtec  **AVOID if Problems With Blood  Pressure         Heartburn: Avoid lying down for at least 1 hour after meals  Aciphex      Maalox     Rash:  Milk of Magnesia     Benadryl    Mylanta       1% Hydrocortisone Cream  Pepcid  Pepcid Complete   Sleep Aids:  Prevacid      Ambien   Prilosec       Benadryl  Rolaids       Chamomile Tea  Tums (Limit 4/day)     Unisom         Tylenol PM         Warm milk-add vanilla or  Hemorrhoids:       Sugar for taste  Anusol/Anusol H.C.  (RX: Analapram  2.5%)  Sugar Substitutes:  Hydrocortisone OTC     Ok in moderation  Preparation H      Tucks        Vaseline lotion applied to tissue with wiping    Herpes:     Throat:  Acyclovir      Oragel  Famvir  Valtrex     Vaccines:         Flu Shot Leg Cramps:       *Gardasil  Benadryl      Hepatitis A         Hepatitis B Nasal Spray:       Pneumovax  Saline Nasal Spray     Polio Booster         Tetanus Nausea:       Tuberculosis test or PPD  Vitamin B6 25 mg TID   AVOID:    Dramamine      *Gardasil  Emetrol       Live Poliovirus  Ginger Root 250 mg QID    MMR (measles, mumps &  High Complex Carbs @ Bedtime    rebella)  Sea Bands-Accupressure    Varicella (Chickenpox)  Unisom 1/2 tab TID     *No known complications           If received before Pain:         Known pregnancy;   Darvocet       Resume series after  Lortab        Delivery  Percocet    Yeast:   Tramadol      Femstat  Tylenol 3      Gyne-lotrimin  Ultram       Monistat  Vicodin           MISC:         All Sunscreens           Hair Coloring/highlights          Insect Repellant's          (Including DEET)         Mystic Tans    Exclusive Breastfeeding  Exclusive breastfeeding means feeding a baby with breast milk only. It is recommended that babies be exclusively breastfed for 6 months. Breastfeeding may continue until a baby is 1 year or older, if wanted by both mother and child. Exclusive breastfeeding for at least 6 months has many benefits for both the  mother and the baby. What are the benefits of exclusive breastfeeding? Exclusive breastfeeding helps your baby grow and develop normally. One reason for this is that breast milk has all the nutrients that a baby needs, when the baby needs them. Breast milk helps develop your baby's immune system by providing proteins called antibodies that help fight off germs. Exclusive breastfeeding may lower your baby's risk for: Stomach and intestinal problems. Allergies. Ear infections. Respiratory infections. Obesity. Diabetes. Sudden infant death syndrome (SIDS). Breastfeeding helps improve your recovery from giving birth by: Reducing how much blood you lose after delivery. Speeding up how quickly your uterus heals. Reducing your risk of postpartum depression. Increasing the time before your routine menstrual periods return (lactational amenorrhea), which can help to delay pregnancy if you are not using birth control. What are some tips for exclusive breastfeeding? Start breastfeeding within your baby's first hour of life. Do not give your baby infant formula, water, or solid food before your baby is 17 months old, unless told by your health care provider. Feed your baby on-demand. This  means feeding anytime your child expresses signs of hunger. This can help maintain your milk supply. Signs of hunger include: Moving restlessly. Rooting. This is when the baby looks like he or she is sucking without anything in the mouth. Bringing hands to the mouth. Crying. Avoid using bottles in the first several weeks. Do not use pacifiers. If you must bottle feed: Continue to offer your baby breast milk by using a breast pump to maintain your milk supply. Pump after feedings and store extra breast milk. Offer only breast milk in a bottle. What happens if I start supplementing feedings? If you work outside the home, it may be difficult to continue exclusive breastfeeding. However, you can make sure your baby  continues to receive only breast milk by pumping and providing breast milk through bottle feeding. Sometimes it is necessary to supplement feedings. If your baby was born prematurely or has vitamin or mineral deficiencies, your health care provider may recommend giving your baby rehydration liquids or vitamin and mineral supplements with breast milk. If you start supplementing feedings, your baby will drink less breast milk and your body will respond by making less breast milk. If you choose to supplement feedings but would like to maintain your milk supply so you can breastfeed your baby exclusively later on, you can pump your breast milk and give your baby your breast milk by bottle. Where to find support Health care providers and lactation specialists. They can help by: Giving you educational materials. Giving you information about where you can get supplies such as breast pumps and nursing bras. Providing you with counseling if you need emotional support. Sharing feeding basics with you, such as effective positions for breastfeeding. Troubleshooting feeding challenges. Your peers. Your friends, family, and other women can help by: Sharing their experiences and success stories. Giving you new ideas. Encouraging you to keep breastfeeding even when it feels difficult. Educational programs about breastfeeding. These programs can help you prepare for breastfeeding before your baby is born. Educational programs include: Classes. Print handouts. Videos. Telephone support. One-on-one instruction. Summary Exclusive breastfeeding means feeding a baby with breast milk only. Exclusive breastfeeding provides many benefits for both you and your baby. Exclusive breastfeeding for the first 6 months of your baby's life is recommended. You can find support for breastfeeding through your health care provider, friends and family, and educational programs. This information is not intended to replace advice  given to you by your health care provider. Make sure you discuss any questions you have with your health care provider. Document Revised: 12/27/2018 Document Reviewed: 07/25/2016 Elsevier Patient Education  [place].

## 2020-09-19 NOTE — L&D Delivery Note (Signed)
Delivery Summary for Emily Farley  Labor Events:   Preterm labor: No data found  Rupture date: 12/26/2020  Rupture time: 7:35 PM  Rupture type: Artificial Bulging bag of water  Fluid Color: Clear  Induction: No data found  Augmentation: No data found  Complications: No data found  Cervical ripening: No data found No data found   No data found     Delivery:   Episiotomy: No data found  Lacerations: No data found  Repair suture: No data found  Repair # of packets: No data found  Blood loss (ml): No data found   Information for the patient's newborn:  Shellee, Streng [[id number]]    Delivery 12/26/2020 9:29 PM by  Vaginal, Spontaneous Sex:  female Gestational Age: [redacted]w[redacted]d Delivery Clinician:   Living?:         APGARS  One minute Five minutes Ten minutes  Skin color:        Heart rate:        Grimace:        Muscle tone:        Breathing:        Totals: 9  9      Presentation/position:      Resuscitation:   Cord information:    Disposition of cord blood:     Blood gases sent?  Complications:   Placenta: Delivered:       appearance Newborn Measurements: Weight: 7 lb 6.9 oz (3370 g)  Height: 20.47"  Head circumference:    Chest circumference:    Other providers:    Additional  information: Forceps:   Vacuum:   Breech:   Observed anomalies         Delivery Note At 9:29 PM a viable and healthy female was delivered via Vaginal, Spontaneous (Presentation: Left Occiput Anterior).  APGAR: 9, 9; weight  3370 grams.    Placenta status: Spontaneous, Intact.  Cord: 3 vessels with the following complications: None.  Cord pH: not obtained.  Delayed cord clamping observed.   Anesthesia: Epidural Episiotomy: None Lacerations: 1st degree perineal Suture Repair: 3.0 vicryl rapide Est. Blood Loss (mL):  250 ml  Mom to postpartum.  Baby to Couplet care / Skin to Skin.  Delivery and vaginal repair were performed by Brayton Mars, [employer] nurse midwifery student under my  direct supervision.     Hildred Laser, MD 12/26/2020, 10:21 PM

## 2020-09-25 ENCOUNTER — Telehealth: Payer: Self-pay

## 2020-09-25 NOTE — Telephone Encounter (Signed)
Pt aware she has a credit balance currently due to making OB payments. PT voiced understanding.

## 2020-10-04 NOTE — Telephone Encounter (Signed)
error 

## 2020-10-07 ENCOUNTER — Other Ambulatory Visit: Payer: Managed Care, Other (non HMO)

## 2020-10-07 ENCOUNTER — Ambulatory Visit (INDEPENDENT_AMBULATORY_CARE_PROVIDER_SITE_OTHER): Payer: Managed Care, Other (non HMO) | Admitting: Obstetrics and Gynecology

## 2020-10-07 ENCOUNTER — Encounter: Payer: Self-pay | Admitting: Obstetrics and Gynecology

## 2020-10-07 ENCOUNTER — Other Ambulatory Visit: Payer: Self-pay

## 2020-10-07 VITALS — BP 113/71 | HR 82 | Wt 151.2 lb

## 2020-10-07 DIAGNOSIS — Z3402 Encounter for supervision of normal first pregnancy, second trimester: Secondary | ICD-10-CM

## 2020-10-07 DIAGNOSIS — Z3A28 28 weeks gestation of pregnancy: Secondary | ICD-10-CM | POA: Diagnosis not present

## 2020-10-07 DIAGNOSIS — Z23 Encounter for immunization: Secondary | ICD-10-CM | POA: Diagnosis not present

## 2020-10-07 LAB — POCT URINALYSIS DIPSTICK OB
Bilirubin, UA: NEGATIVE
Blood, UA: NEGATIVE
Glucose, UA: NEGATIVE
Ketones, UA: NEGATIVE
Leukocytes, UA: NEGATIVE
Nitrite, UA: NEGATIVE
POC,PROTEIN,UA: NEGATIVE
Spec Grav, UA: 1.01
Urobilinogen, UA: 0.2 U/dL
pH, UA: 6.5

## 2020-10-07 NOTE — Addendum Note (Signed)
Addended by: Dorian Pod on: 10/07/2020 02:37 PM   Modules accepted: Orders

## 2020-10-07 NOTE — Progress Notes (Signed)
ROB: 1 hour GCT today.  Patient has no complaints.  Has been walking daily and she says she feels much better since beginning this.  Reports daily fetal movement.  Taking vitamins as directed.

## 2020-10-08 LAB — GLUCOSE, 1 HOUR GESTATIONAL: Gestational Diabetes Screen: 74 mg/dL (ref 65–139)

## 2020-10-08 LAB — CBC
Hematocrit: 38.2 % (ref 34.0–46.6)
Hemoglobin: 13 g/dL (ref 11.1–15.9)
MCH: 30.5 pg (ref 26.6–33.0)
MCHC: 34 g/dL (ref 31.5–35.7)
MCV: 90 fL (ref 79–97)
Platelets: 286 10*3/uL (ref 150–450)
RBC: 4.26 x10E6/uL (ref 3.77–5.28)
RDW: 12.1 % (ref 11.7–15.4)
WBC: 13.2 10*3/uL — ABNORMAL HIGH (ref 3.4–10.8)

## 2020-10-08 LAB — RPR: RPR Ser Ql: NONREACTIVE

## 2020-10-28 ENCOUNTER — Encounter: Payer: Self-pay | Admitting: Obstetrics and Gynecology

## 2020-10-28 ENCOUNTER — Other Ambulatory Visit: Payer: Self-pay

## 2020-10-28 ENCOUNTER — Ambulatory Visit (INDEPENDENT_AMBULATORY_CARE_PROVIDER_SITE_OTHER): Payer: Managed Care, Other (non HMO) | Admitting: Obstetrics and Gynecology

## 2020-10-28 VITALS — BP 102/71 | HR 87 | Wt 155.5 lb

## 2020-10-28 DIAGNOSIS — Z3483 Encounter for supervision of other normal pregnancy, third trimester: Secondary | ICD-10-CM

## 2020-10-28 DIAGNOSIS — Z3A31 31 weeks gestation of pregnancy: Secondary | ICD-10-CM

## 2020-10-28 LAB — POCT URINALYSIS DIPSTICK OB
Bilirubin, UA: NEGATIVE
Blood, UA: NEGATIVE
Glucose, UA: NEGATIVE
Ketones, UA: NEGATIVE
Nitrite, UA: NEGATIVE
POC,PROTEIN,UA: NEGATIVE
Spec Grav, UA: 1.015 (ref 1.010–1.025)
Urobilinogen, UA: 0.2 E.U./dL
pH, UA: 7 (ref 5.0–8.0)

## 2020-10-28 NOTE — Progress Notes (Signed)
ROB: Patient denies complaints. Normal 28 week labs. RTC in 2-3 weeks.   The following were addressed during this visit:  Breastfeeding Education - Nonpharmacological pain relief methods for labor    Comments: Deep breathing, focusing on pleasant things, movement and walking, heating pads or cold compress, massage and relaxation, continuous support from someone you trust, and Doulas   - The importance of early skin-to-skin contact    Comments: Keeps baby warm and secure, helps keep baby's blood sugar up and breathing steady, easier to bond and breastfeed, and helps calm baby.  - Rooming-in on a 24-hour basis    Comments: Easier to learn baby's feeding cues, easier to bond and get to know each other, and encourages milk production.

## 2020-10-28 NOTE — Patient Instructions (Signed)
WHAT OB PATIENTS CAN EXPECT   Confirmation of pregnancy and ultrasound ordered if medically indicated-[redacted] weeks gestation  New OB (NOB) intake with nurse and New OB (NOB) labs- [redacted] weeks gestation  New OB (NOB) physical examination with provider- 11/[redacted] weeks gestation  Flu vaccine-[redacted] weeks gestation  Anatomy scan-[redacted] weeks gestation  Glucose tolerance test, blood work to test for anemia, T-dap vaccine-[redacted] weeks gestation  Vaginal swabs/cultures-STD/Group B strep-[redacted] weeks gestation  Appointments every 4 weeks until 28 weeks  Every 2 weeks from 28 weeks until 36 weeks  Weekly visits from 36 weeks until delivery  Common Medications Safe in Pregnancy  Acne:      Constipation:  Benzoyl Peroxide     Colace  Clindamycin      Dulcolax Suppository  Topica Erythromycin     Fibercon  Salicylic Acid      Metamucil         Miralax AVOID:        Senakot   Accutane    Cough:  Retin-A       Cough Drops  Tetracycline      Phenergan w/ Codeine if Rx  Minocycline      Robitussin (Plain & DM)  Antibiotics:     Crabs/Lice:  Ceclor       RID  Cephalosporins    AVOID:  E-Mycins      Kwell  Keflex  Macrobid/Macrodantin   Diarrhea:  Penicillin      Kao-Pectate  Zithromax      Imodium AD         PUSH FLUIDS AVOID:       Cipro     Fever:  Tetracycline      Tylenol (Regular or Extra  Minocycline       Strength)  Levaquin      Extra Strength-Do not          Exceed 8 tabs/24 hrs Caffeine:        <256m/day (equiv. To 1 cup of coffee or  approx. 3 12 oz sodas)         Gas: Cold/Hayfever:       Gas-X  Benadryl      Mylicon  Claritin       Phazyme  **Claritin-D        Chlor-Trimeton    Headaches:  Dimetapp      ASA-Free Excedrin  Drixoral-Non-Drowsy     Cold Compress  Mucinex (Guaifenasin)     Tylenol (Regular or Extra  Sudafed/Sudafed-12 Hour     Strength)  **Sudafed PE Pseudoephedrine   Tylenol Cold & Sinus     Vicks Vapor Rub  Zyrtec  **AVOID if Problems With Blood  Pressure         Heartburn: Avoid lying down for at least 1 hour after meals  Aciphex      Maalox     Rash:  Milk of Magnesia     Benadryl    Mylanta       1% Hydrocortisone Cream  Pepcid  Pepcid Complete   Sleep Aids:  Prevacid      Ambien   Prilosec       Benadryl  Rolaids       Chamomile Tea  Tums (Limit 4/day)     Unisom         Tylenol PM         Warm milk-add vanilla or  Hemorrhoids:       Sugar for taste  Anusol/Anusol H.C.  (RX: Analapram 2.5%)  Sugar Substitutes:  Hydrocortisone OTC     Ok in moderation  Preparation H      Tucks        Vaseline lotion applied to tissue with wiping    Herpes:     Throat:  Acyclovir      Oragel  Famvir  Valtrex     Vaccines:         Flu Shot Leg Cramps:       *Gardasil  Benadryl      Hepatitis A         Hepatitis B Nasal Spray:       Pneumovax  Saline Nasal Spray     Polio Booster         Tetanus Nausea:       Tuberculosis test or PPD  Vitamin B6 25 mg TID   AVOID:    Dramamine      *Gardasil  Emetrol       Live Poliovirus  Ginger Root 250 mg QID    MMR (measles, mumps &  High Complex Carbs @ Bedtime    rebella)  Sea Bands-Accupressure    Varicella (Chickenpox)  Unisom 1/2 tab TID     *No known complications           If received before Pain:         Known pregnancy;   Darvocet       Resume series after  Lortab        Delivery  Percocet    Yeast:   Tramadol      Femstat  Tylenol 3      Gyne-lotrimin  Ultram       Monistat  Vicodin           MISC:         All Sunscreens           Hair Coloring/highlights          Insect Repellant's          (Including DEET)         Mystic Tans

## 2020-10-28 NOTE — Progress Notes (Signed)
ROB-Pt present for routine prenatal care. Pt stated that she was doing well no problems.  

## 2020-11-17 ENCOUNTER — Encounter: Payer: Managed Care, Other (non HMO) | Admitting: Obstetrics and Gynecology

## 2020-11-24 ENCOUNTER — Other Ambulatory Visit: Payer: Self-pay

## 2020-11-24 ENCOUNTER — Encounter: Payer: Self-pay | Admitting: Obstetrics and Gynecology

## 2020-11-24 ENCOUNTER — Ambulatory Visit (INDEPENDENT_AMBULATORY_CARE_PROVIDER_SITE_OTHER): Payer: Managed Care, Other (non HMO) | Admitting: Obstetrics and Gynecology

## 2020-11-24 VITALS — BP 103/69 | HR 84 | Wt 160.8 lb

## 2020-11-24 DIAGNOSIS — Z3A35 35 weeks gestation of pregnancy: Secondary | ICD-10-CM

## 2020-11-24 DIAGNOSIS — O09299 Supervision of pregnancy with other poor reproductive or obstetric history, unspecified trimester: Secondary | ICD-10-CM

## 2020-11-24 DIAGNOSIS — Z3483 Encounter for supervision of other normal pregnancy, third trimester: Secondary | ICD-10-CM

## 2020-11-24 LAB — POCT URINALYSIS DIPSTICK OB
Bilirubin, UA: NEGATIVE
Blood, UA: NEGATIVE
Glucose, UA: NEGATIVE
Ketones, UA: NEGATIVE
Leukocytes, UA: NEGATIVE
Nitrite, UA: NEGATIVE
POC,PROTEIN,UA: NEGATIVE
Spec Grav, UA: 1.015 (ref 1.010–1.025)
Urobilinogen, UA: 0.2 E.U./dL
pH, UA: 8 (ref 5.0–8.0)

## 2020-11-24 NOTE — Progress Notes (Signed)
ROB: No complaints.  She is doing well.  Cultures next visit.  Ultrasound for fetal growth and position ordered next week. (History of macrosomia)

## 2020-12-04 ENCOUNTER — Other Ambulatory Visit: Payer: Self-pay

## 2020-12-04 ENCOUNTER — Encounter: Payer: Self-pay | Admitting: Obstetrics and Gynecology

## 2020-12-04 ENCOUNTER — Ambulatory Visit (INDEPENDENT_AMBULATORY_CARE_PROVIDER_SITE_OTHER): Payer: Managed Care, Other (non HMO) | Admitting: Obstetrics and Gynecology

## 2020-12-04 VITALS — BP 101/70 | HR 87 | Wt 162.8 lb

## 2020-12-04 DIAGNOSIS — Z3A36 36 weeks gestation of pregnancy: Secondary | ICD-10-CM

## 2020-12-04 DIAGNOSIS — Z3483 Encounter for supervision of other normal pregnancy, third trimester: Secondary | ICD-10-CM

## 2020-12-04 DIAGNOSIS — O09299 Supervision of pregnancy with other poor reproductive or obstetric history, unspecified trimester: Secondary | ICD-10-CM

## 2020-12-04 LAB — POCT URINALYSIS DIPSTICK OB
Bilirubin, UA: NEGATIVE
Blood, UA: NEGATIVE
Glucose, UA: NEGATIVE
Ketones, UA: NEGATIVE
Leukocytes, UA: NEGATIVE
Nitrite, UA: NEGATIVE
POC,PROTEIN,UA: NEGATIVE
Spec Grav, UA: 1.01 (ref 1.010–1.025)
Urobilinogen, UA: 0.2 E.U./dL
pH, UA: 7 (ref 5.0–8.0)

## 2020-12-04 NOTE — Progress Notes (Signed)
ROB: Doing well, no complaints. 36 week cultures done. Discussed labor precautions. RTC in 1 week. Has growth [place] for h/o macrosomia next week.

## 2020-12-04 NOTE — Patient Instructions (Signed)

## 2020-12-04 NOTE — Progress Notes (Signed)
OB-Pt present for routine prenatal care and 36 week cultures. Pt stated that she was doing well.

## 2020-12-06 LAB — STREP GP B NAA+RFLX: Strep Gp B NAA+Rflx: NEGATIVE

## 2020-12-07 LAB — GC/CHLAMYDIA PROBE AMP
Chlamydia trachomatis, NAA: NEGATIVE
Neisseria Gonorrhoeae by PCR: NEGATIVE

## 2020-12-08 ENCOUNTER — Other Ambulatory Visit: Payer: Self-pay

## 2020-12-08 ENCOUNTER — Ambulatory Visit
Admission: RE | Admit: 2020-12-08 | Discharge: 2020-12-08 | Disposition: A | Discharge status: Home / Self Care | Payer: Managed Care, Other (non HMO) | Source: Ambulatory Visit | Attending: Obstetrics and Gynecology | Admitting: Obstetrics and Gynecology

## 2020-12-08 DIAGNOSIS — O09299 Supervision of pregnancy with other poor reproductive or obstetric history, unspecified trimester: Secondary | ICD-10-CM

## 2020-12-08 DIAGNOSIS — O09293 Supervision of pregnancy with other poor reproductive or obstetric history, third trimester: Secondary | ICD-10-CM | POA: Insufficient documentation

## 2020-12-08 DIAGNOSIS — Z3A35 35 weeks gestation of pregnancy: Secondary | ICD-10-CM | POA: Insufficient documentation

## 2020-12-10 ENCOUNTER — Other Ambulatory Visit: Payer: Self-pay

## 2020-12-10 ENCOUNTER — Encounter: Payer: Self-pay | Admitting: Obstetrics and Gynecology

## 2020-12-10 ENCOUNTER — Ambulatory Visit (INDEPENDENT_AMBULATORY_CARE_PROVIDER_SITE_OTHER): Payer: Managed Care, Other (non HMO) | Admitting: Obstetrics and Gynecology

## 2020-12-10 VITALS — BP 112/79 | HR 83 | Wt 163.9 lb

## 2020-12-10 DIAGNOSIS — Z3A37 37 weeks gestation of pregnancy: Secondary | ICD-10-CM

## 2020-12-10 DIAGNOSIS — Z3483 Encounter for supervision of other normal pregnancy, third trimester: Secondary | ICD-10-CM

## 2020-12-10 NOTE — Progress Notes (Signed)
ROB: No complaints.  Ultrasound shows 60% growth.  I have discussed this with her.  Patient interested in discussing postdates planning.  Discussed NSTs and induction approximately 41 weeks.  All questions answered.

## 2020-12-11 ENCOUNTER — Ambulatory Visit: Payer: Managed Care, Other (non HMO)

## 2020-12-17 ENCOUNTER — Encounter: Payer: Managed Care, Other (non HMO) | Admitting: Obstetrics and Gynecology

## 2020-12-25 ENCOUNTER — Ambulatory Visit (INDEPENDENT_AMBULATORY_CARE_PROVIDER_SITE_OTHER): Payer: Managed Care, Other (non HMO) | Admitting: Obstetrics and Gynecology

## 2020-12-25 ENCOUNTER — Other Ambulatory Visit: Payer: Self-pay

## 2020-12-25 ENCOUNTER — Encounter: Payer: Self-pay | Admitting: Obstetrics and Gynecology

## 2020-12-25 VITALS — BP 105/72 | HR 85 | Wt 166.1 lb

## 2020-12-25 DIAGNOSIS — Z3483 Encounter for supervision of other normal pregnancy, third trimester: Secondary | ICD-10-CM

## 2020-12-25 DIAGNOSIS — Z3A39 39 weeks gestation of pregnancy: Secondary | ICD-10-CM

## 2020-12-25 LAB — POCT URINALYSIS DIPSTICK OB
Bilirubin, UA: NEGATIVE
Blood, UA: NEGATIVE
Glucose, UA: NEGATIVE
Ketones, UA: NEGATIVE
Leukocytes, UA: NEGATIVE
Nitrite, UA: NEGATIVE
POC,PROTEIN,UA: NEGATIVE
Spec Grav, UA: 1.015 (ref 1.010–1.025)
Urobilinogen, UA: 0.2 E.U./dL
pH, UA: 6.5 (ref 5.0–8.0)

## 2020-12-25 NOTE — Progress Notes (Signed)
OB-Pt present for routine prenatal care. Pt stated having regular contractions last night, no vaginal bleeding only abd pain.

## 2020-12-25 NOTE — Progress Notes (Signed)
ROB: Had possible false labor contractions last night, but they dissipated when she went to bed. Is ok with membrane sweeping today. Discussed labor precautions again.  RTC in 1 week if undelivered, and for NST for postdates. Will schedule for IOL at 41 weeks, scheduled for 01/04/2021 at midnight. Will require COVID testing prior to IOL.

## 2020-12-25 NOTE — Patient Instructions (Addendum)
Signs and Symptoms of Labor Labor is the body's natural process of moving the baby and the placenta out of the uterus. The process of labor usually starts when the baby is full-term, between 37 and 40 weeks of pregnancy. Signs and symptoms that you are close to going into labor As your body prepares for labor and the birth of your baby, you may notice the following symptoms in the weeks and days before true labor starts:  Passing a small amount of thick, bloody mucus from your vagina. This is called normal bloody show or losing your mucus plug. This may happen more than a week before labor begins, or right before labor begins, as the opening of the cervix starts to widen (dilate). For some women, the entire mucus plug passes at once. For others, pieces of the mucus plug may gradually pass over several days.  Your baby moving (dropping) lower in your pelvis to get into position for birth (lightening). When this happens, you may feel more pressure on your bladder and pelvic bone and less pressure on your ribs. This may make it easier to breathe. It may also cause you to need to urinate more often and have problems with bowel movements.  Having "practice contractions," also called Braxton Hicks contractions or false labor. These occur at irregular (unevenly spaced) intervals that are more than 10 minutes apart. False labor contractions are common after exercise or sexual activity. They will stop if you change position, rest, or drink fluids. These contractions are usually mild and do not get stronger over time. They may feel like: ? A backache or back pain. ? Mild cramps, similar to menstrual cramps. ? Tightening or pressure in your abdomen. Other early symptoms include:  Nausea or loss of appetite.  Diarrhea.  Having a sudden burst of energy, or feeling very tired.  Mood changes.  Having trouble sleeping.   Signs and symptoms that labor has begun Signs that you are in labor may  include:  Having contractions that come at regular (evenly spaced) intervals and increase in intensity. This may feel like more intense tightening or pressure in your abdomen that moves to your back. ? Contractions may also feel like rhythmic pain in your upper thighs or back that comes and goes at regular intervals. ? For first-time mothers, this change in intensity of contractions often occurs at a more gradual pace. ? Women who have given birth before may notice a more rapid progression of contraction changes.  Feeling pressure in the vaginal area.  Your water breaking (rupture of membranes). This is when the sac of fluid that surrounds your baby breaks. Fluid leaking from your vagina may be clear or blood-tinged. Labor usually starts within 24 hours of your water breaking, but it may take longer to begin. ? Some women may feel a sudden gush of fluid. ? Others notice that their underwear repeatedly becomes damp. Follow these instructions at home:  When labor starts, or if your water breaks, call your health care provider or nurse care line. Based on your situation, they will determine when you should go in for an exam.  During early labor, you may be able to rest and manage symptoms at home. Some strategies to try at home include: ? Breathing and relaxation techniques. ? Taking a warm bath or shower. ? Listening to music. ? Using a heating pad on the lower back for pain. If you are directed to use heat:  Place a towel between your skin and the   heat source.  Leave the heat on for 20-30 minutes.  Remove the heat if your skin turns bright red. This is especially important if you are unable to feel pain, heat, or cold. You may have a greater risk of getting burned.   Contact a health care provider if:  Your labor has started.  Your water breaks. Get help right away if:  You have painful, regular contractions that are 5 minutes apart or less.  Labor starts before you are [redacted] weeks  along in your pregnancy.  You have a fever.  You have bright red blood coming from your vagina.  You do not feel your baby moving.  You have a severe headache with or without vision problems.  You have severe nausea, vomiting, or diarrhea.  You have chest pain or shortness of breath. These symptoms may represent a serious problem that is an emergency. Do not wait to see if the symptoms will go away. Get medical help right away. Call your local emergency services (911 in the [place] Do not drive yourself to the hospital. Summary  Labor is your body's natural process of moving your baby and the placenta out of your uterus.  The process of labor usually starts when your baby is full-term, between 74 and 40 weeks of pregnancy.  When labor starts, or if your water breaks, call your health care provider or nurse care line. Based on your situation, they will determine when you should go in for an exam. This information is not intended to replace advice given to you by your health care provider. Make sure you discuss any questions you have with your health care provider. Document Revised: 06/27/2020 Document Reviewed: 06/27/2020 Elsevier Patient Education  2021 [employer]       COVID 19 Instructions for Scheduled Procedure (Inductions/C-sections and GYN surgeries)   Thank you for choosing [facility] for your services.  You have been scheduled for a procedure called ________Induction of Labor_____________.    Your procedure is scheduled on ______Monday, 01/04/2021 at midnight (come in Sunday night at 11:55 pm)______________.  You are required to have COVID-19 testing performed 2 days prior to your scheduled procedure date.  Testing is performed between 8 AM and 2:0 PM Monday through Friday.  Please present for testing on ___4/15/2021____ during this hour. You will need to present to the [facility] (this is next to the [facility]), [address] in the Pre-Admit  Testing office.    Upon your scheduled procedure date, you will need to arrive at the [facility] entrance. (There is a statue at the front of this entrance.)   Please arrive on time if you are scheduled for an induction of labor.   If you are scheduled for a Cesarean delivery or for Gyn Surgery, arrive 2 hours prior to your procedure time.   If you are an Obstetric patient and your arrival time falls between 11 PM and 6 AM call L&D [phone]) when you arrive.  A staff member will meet you at the [facility] entrance.  At this time, patients are allowed 1 support person to accompany them. Face masks are required for you and your support person. Your support person is now allowed to be there with you during the entire time of your admission.   Please contact the office if you have any questions regarding this information.  The [facility] office number is [phone].     Thank you,    Your [facility] Providers

## 2020-12-26 ENCOUNTER — Inpatient Hospital Stay: Payer: Managed Care, Other (non HMO) | Admitting: Anesthesiology

## 2020-12-26 ENCOUNTER — Inpatient Hospital Stay
Admission: EM | Admit: 2020-12-26 | Discharge: 2020-12-28 | DRG: 807 | Disposition: A | Discharge status: Home / Self Care | Payer: Managed Care, Other (non HMO) | Attending: Obstetrics and Gynecology | Admitting: Obstetrics and Gynecology

## 2020-12-26 ENCOUNTER — Encounter: Payer: Self-pay | Admitting: Obstetrics and Gynecology

## 2020-12-26 ENCOUNTER — Other Ambulatory Visit: Payer: Self-pay

## 2020-12-26 DIAGNOSIS — Z3A39 39 weeks gestation of pregnancy: Secondary | ICD-10-CM

## 2020-12-26 DIAGNOSIS — O09293 Supervision of pregnancy with other poor reproductive or obstetric history, third trimester: Secondary | ICD-10-CM

## 2020-12-26 DIAGNOSIS — O26893 Other specified pregnancy related conditions, third trimester: Secondary | ICD-10-CM | POA: Diagnosis present

## 2020-12-26 DIAGNOSIS — Z20822 Contact with and (suspected) exposure to covid-19: Secondary | ICD-10-CM | POA: Diagnosis present

## 2020-12-26 HISTORY — DX: Supervision of pregnancy with other poor reproductive or obstetric history, third trimester: O09.293

## 2020-12-26 LAB — CBC
HCT: 40.4 % (ref 36.0–46.0)
Hemoglobin: 14.1 g/dL (ref 12.0–15.0)
MCH: 29.7 pg (ref 26.0–34.0)
MCHC: 34.9 g/dL (ref 30.0–36.0)
MCV: 85.1 fL (ref 80.0–100.0)
Platelets: 253 K/uL (ref 150–400)
RBC: 4.75 MIL/uL (ref 3.87–5.11)
RDW: 13.4 % (ref 11.5–15.5)
WBC: 15.9 K/uL — ABNORMAL HIGH (ref 4.0–10.5)
nRBC: 0 % (ref 0.0–0.2)

## 2020-12-26 LAB — TYPE AND SCREEN
ABO/RH(D): O POS
Antibody Screen: NEGATIVE

## 2020-12-26 LAB — RAPID HIV SCREEN (HIV 1/2 AB+AG)
HIV 1/2 Antibodies: NONREACTIVE
HIV-1 P24 Antigen - HIV24: NONREACTIVE

## 2020-12-26 LAB — RESP PANEL BY RT-PCR (FLU A&B, COVID) ARPGX2
Influenza A by PCR: NEGATIVE
Influenza B by PCR: NEGATIVE
SARS Coronavirus 2 by RT PCR: NEGATIVE

## 2020-12-26 MED ORDER — OXYTOCIN-SODIUM CHLORIDE 30-0.9 UT/500ML-% IV SOLN: 1.0000 m[IU]/min | INTRAVENOUS | Status: DC

## 2020-12-26 MED ORDER — LACTATED RINGERS IV SOLN
500.0000 mL | Freq: Once | INTRAVENOUS | Status: AC
  Administered 2020-12-26: 500 mL via INTRAVENOUS

## 2020-12-26 MED ORDER — LIDOCAINE HCL (PF) 1 % IJ SOLN
INTRAMUSCULAR | Status: DC | PRN
  Administered 2020-12-26: 3 mL via SUBCUTANEOUS

## 2020-12-26 MED ORDER — SODIUM CHLORIDE 0.9 % IV SOLN
INTRAVENOUS | Status: DC | PRN
  Administered 2020-12-26: 10 mL via EPIDURAL

## 2020-12-26 MED ORDER — FENTANYL 2.5 MCG/ML W/ROPIVACAINE 0.15% IN NS 100 ML EPIDURAL (ARMC)
12.0000 mL/h | EPIDURAL | Status: DC
  Administered 2020-12-26: 12 mL/h via EPIDURAL

## 2020-12-26 MED ORDER — EPHEDRINE 5 MG/ML INJ
10.0000 mg | INTRAVENOUS | Status: AC | PRN
  Administered 2020-12-26: 10 mg via INTRAVENOUS
  Filled 2020-12-26: qty 4

## 2020-12-26 MED ORDER — IBUPROFEN 600 MG PO TABS
600.0000 mg | ORAL_TABLET | Freq: Four times a day (QID) | ORAL | Status: DC
  Administered 2020-12-26 – 2020-12-28 (×6): 600 mg via ORAL
  Filled 2020-12-26 (×6): qty 1

## 2020-12-26 MED ORDER — SOD CITRATE-CITRIC ACID 500-334 MG/5ML PO SOLN: 30.0000 mL | ORAL | Status: DC | PRN

## 2020-12-26 MED ORDER — BENZOCAINE-MENTHOL 20-0.5 % EX AERO
1.0000 | INHALATION_SPRAY | CUTANEOUS | Status: DC | PRN
Start: 2020-12-26 — End: 2020-12-28

## 2020-12-26 MED ORDER — ONDANSETRON HCL 4 MG/2ML IJ SOLN
4.0000 mg | Freq: Four times a day (QID) | INTRAMUSCULAR | Status: DC | PRN
  Administered 2020-12-26: 4 mg via INTRAVENOUS
  Filled 2020-12-26: qty 2

## 2020-12-26 MED ORDER — MISOPROSTOL 200 MCG PO TABS
ORAL_TABLET | ORAL | Status: AC
  Filled 2020-12-26: qty 4

## 2020-12-26 MED ORDER — ACETAMINOPHEN 325 MG PO TABS: 650.0000 mg | ORAL_TABLET | ORAL | Status: DC | PRN

## 2020-12-26 MED ORDER — PHENYLEPHRINE 40 MCG/ML (10ML) SYRINGE FOR IV PUSH (FOR BLOOD PRESSURE SUPPORT)
80.0000 ug | PREFILLED_SYRINGE | INTRAVENOUS | Status: DC | PRN
  Filled 2020-12-26: qty 10

## 2020-12-26 MED ORDER — LACTATED RINGERS IV SOLN
500.0000 mL | INTRAVENOUS | Status: DC | PRN
  Administered 2020-12-26: 1000 mL via INTRAVENOUS

## 2020-12-26 MED ORDER — OXYCODONE-ACETAMINOPHEN 5-325 MG PO TABS
1.0000 | ORAL_TABLET | ORAL | Status: DC | PRN
Start: 2020-12-26 — End: 2020-12-26

## 2020-12-26 MED ORDER — EPHEDRINE 5 MG/ML INJ
INTRAVENOUS | Status: AC
  Administered 2020-12-26: 10 mg via INTRAVENOUS
  Filled 2020-12-26: qty 4

## 2020-12-26 MED ORDER — TERBUTALINE SULFATE 1 MG/ML IJ SOLN: 0.2500 mg | Freq: Once | INTRAMUSCULAR | Status: DC | PRN

## 2020-12-26 MED ORDER — BUTORPHANOL TARTRATE 1 MG/ML IJ SOLN
1.0000 mg | INTRAMUSCULAR | Status: DC | PRN
Start: 2020-12-26 — End: 2020-12-26

## 2020-12-26 MED ORDER — AMMONIA AROMATIC IN INHA
RESPIRATORY_TRACT | Status: AC
  Filled 2020-12-26: qty 10

## 2020-12-26 MED ORDER — DIPHENHYDRAMINE HCL 50 MG/ML IJ SOLN: 12.5000 mg | INTRAMUSCULAR | Status: DC | PRN

## 2020-12-26 MED ORDER — LACTATED RINGERS IV SOLN: INTRAVENOUS | Status: DC

## 2020-12-26 MED ORDER — FENTANYL 2.5 MCG/ML W/ROPIVACAINE 0.15% IN NS 100 ML EPIDURAL (ARMC)
EPIDURAL | Status: AC
  Filled 2020-12-26: qty 100

## 2020-12-26 MED ORDER — OXYTOCIN 10 UNIT/ML IJ SOLN
INTRAMUSCULAR | Status: AC
  Filled 2020-12-26: qty 2

## 2020-12-26 MED ORDER — OXYCODONE-ACETAMINOPHEN 5-325 MG PO TABS: 2.0000 | ORAL_TABLET | ORAL | Status: DC | PRN

## 2020-12-26 MED ORDER — OXYTOCIN BOLUS FROM INFUSION
333.0000 mL | Freq: Once | INTRAVENOUS | Status: AC
  Administered 2020-12-26: 333 mL via INTRAVENOUS

## 2020-12-26 MED ORDER — EPHEDRINE 5 MG/ML INJ
10.0000 mg | INTRAVENOUS | Status: DC | PRN
  Filled 2020-12-26: qty 2

## 2020-12-26 MED ORDER — WITCH HAZEL-GLYCERIN EX PADS
1.0000 | MEDICATED_PAD | CUTANEOUS | Status: DC | PRN
Start: 2020-12-26 — End: 2020-12-28

## 2020-12-26 MED ORDER — OXYTOCIN-SODIUM CHLORIDE 30-0.9 UT/500ML-% IV SOLN
2.5000 [IU]/h | INTRAVENOUS | Status: DC
  Filled 2020-12-26: qty 500

## 2020-12-26 MED ORDER — LIDOCAINE-EPINEPHRINE (PF) 1.5 %-1:200000 IJ SOLN
INTRAMUSCULAR | Status: DC | PRN
  Administered 2020-12-26: 3 mL via EPIDURAL

## 2020-12-26 MED ORDER — DIBUCAINE (PERIANAL) 1 % EX OINT: 1.0000 "application " | TOPICAL_OINTMENT | CUTANEOUS | Status: DC | PRN

## 2020-12-26 MED ORDER — LIDOCAINE HCL (PF) 1 % IJ SOLN
30.0000 mL | INTRAMUSCULAR | Status: DC | PRN
  Filled 2020-12-26: qty 30

## 2020-12-26 NOTE — Progress Notes (Signed)
Pt's husband called RN to room for concern of "eye drooping." RN at bedside and assessed hand grip strength which was positive. Vital signs taken and BP treated for a low blood pressure. Pt states "I am having a hard time taking a deep breath and I feel off." Corinda Gubler MD notified and requested to evaluate pt at bedside.

## 2020-12-26 NOTE — Anesthesia Preprocedure Evaluation (Signed)
Anesthesia Evaluation  Patient identified by MRN, date of birth, ID band Patient awake    Reviewed: Allergy & Precautions, NPO status , Patient's Chart, lab work & pertinent test results  History of Anesthesia Complications Negative for: history of anesthetic complications  Airway Mallampati: III  TM Distance: >3 FB Neck ROM: Full    Dental no notable dental hx. (+) Teeth Intact   Pulmonary neg pulmonary ROS, neg sleep apnea, neg COPD, Patient abstained from smoking.Not current smoker,    Pulmonary exam normal breath sounds clear to auscultation       Cardiovascular Exercise Tolerance: Good METS(-) hypertension(-) CAD and (-) Past MI negative cardio ROS  (-) dysrhythmias  Rhythm:Regular Rate:Normal - Systolic murmurs    Neuro/Psych  Headaches, negative psych ROS   GI/Hepatic neg GERD  ,(+)     (-) substance abuse  ,   Endo/Other  neg diabetes  Renal/GU negative Renal ROS     Musculoskeletal   Abdominal   Peds  Hematology   Anesthesia Other Findings Past Medical History: No date: Headache  Reproductive/Obstetrics (+) Pregnancy                             Anesthesia Physical Anesthesia Plan  ASA: II  Anesthesia Plan: Epidural   Post-op Pain Management:    Induction:   PONV Risk Score and Plan: 3 and Treatment may vary due to age or medical condition and Ondansetron  Airway Management Planned: Natural Airway  Additional Equipment:   Intra-op Plan:   Post-operative Plan:   Informed Consent: I have reviewed the patients History and Physical, chart, labs and discussed the procedure including the risks, benefits and alternatives for the proposed anesthesia with the patient or authorized representative who has indicated his/her understanding and acceptance.       Plan Discussed with: Surgeon  Anesthesia Plan Comments: (Discussed R/B/A of neuraxial anesthesia technique  with patient: - rare risks of spinal/epidural hematoma, nerve damage, infection - Risk of PDPH - Risk of itching - Risk of nausea and vomiting - Risk of poor block necessitating replacement of epidural. Patient voiced understanding.)        Anesthesia Quick Evaluation

## 2020-12-26 NOTE — OB Triage Note (Signed)
Pt presented to L/D triage with reported contractions that began a few hours ago and have become more painful/regular for the past two hours. Pt describes pain as intermittent, rated 7/10. Pt reports leaking of clear fluid at 1500. Reported bloody show. Pt reports positive fetal movement. Monitors applied and assessing. VSS.

## 2020-12-26 NOTE — Anesthesia Procedure Notes (Signed)
Epidural Patient location during procedure: OB Start time: 12/26/2020 6:10 PM End time: 12/26/2020 6:28 PM  Staffing Anesthesiologist: Corinda Gubler, MD Performed: anesthesiologist   Preanesthetic Checklist Completed: patient identified, IV checked, site marked, risks and benefits discussed, surgical consent, monitors and equipment checked, pre-op evaluation and timeout performed  Epidural Patient position: sitting Prep: ChloraPrep Patient monitoring: heart rate, continuous pulse ox and blood pressure Approach: midline Location: L3-L4 Injection technique: LOR saline  Needle:  Needle type: Tuohy  Needle gauge: 17 G Needle length: 9 cm and 9 Catheter type: closed end flexible Catheter size: 19 Gauge Test dose: negative and 1.5% lidocaine with Epi 1:200 K  Assessment Sensory level: T10 Events: blood not aspirated, injection not painful, no injection resistance, no paresthesia and negative IV test  Additional Notes first attempt Pt. Evaluated and documentation done after procedure finished. Patient identified. Risks/Benefits/Options discussed with patient including but not limited to bleeding, infection, nerve damage, paralysis, failed block, incomplete pain control, headache, blood pressure changes, nausea, vomiting, reactions to medication both or allergic, itching and postpartum back pain. Confirmed with bedside nurse the patient's most recent platelet count. Confirmed with patient that they are not currently taking any anticoagulation, have any bleeding history or any family history of bleeding disorders. Patient expressed understanding and wished to proceed. All questions were answered. Sterile technique was used throughout the entire procedure. Please see nursing notes for vital signs. Test dose was given through epidural catheter and negative prior to continuing to dose epidural or start infusion. Warning signs of high block given to the patient including shortness of breath,  tingling/numbness in hands, complete motor block, or any concerning symptoms with instructions to call for help. Patient was given instructions on fall risk and not to get out of bed. All questions and concerns addressed with instructions to call with any issues or inadequate analgesia.   Patient tolerated the insertion well without immediate complications.Reason for block:procedure for pain

## 2020-12-26 NOTE — H&P (Signed)
Obstetric History and Physical  Ashwika Freels is a 25 y.o. G3P2002 with IUP at [redacted]w[redacted]d presenting for complaints of contractions, worsening today over the past few hours, but also had some since yesterday. Had membrane sweeping performed in office. Patient states she has been having regular 3-4 minutes contractions, minimal vaginal bleeding, intact membranes, with active fetal movement.    Prenatal Course Source of Care: [facility] with onset of care at 8 weeks Pregnancy complications or risks: Patient Active Problem List   Diagnosis Date Noted  . Normal labor 12/26/2020  . Prior fetal macrosomia, antepartum, third trimester 12/26/2020  . Post-dates pregnancy 01/11/2019   She plans to breastfeed   Prenatal labs and studies: ABO, Rh: --/--/O POS (04/09 1705) Antibody: NEG (04/09 1705) Rubella: 6.52 (09/02 0928) RPR: Non Reactive (01/19 1018)  HBsAg: Negative (09/02 0928)  HIV:    GBS:--/Negative (03/18 1643) 1 hr Glucola  Normal (74) Genetic screening normal (quad screen) Anatomy US normal   Past Medical History:  Diagnosis Date  . Headache     Past Surgical History:  Procedure Laterality Date  . APPENDECTOMY      OB History  Gravida Para Term Preterm AB Living  3 2 2     2   SAB IAB Ectopic Multiple Live Births        0 2    # Outcome Date GA Lbr Len/2nd Weight Sex Delivery Anes PTL Lv  3 Current           2 Term 01/11/19 [redacted]w[redacted]d 00:42 / 00:33 3660 g M Vag-Spont EPI, Local  LIV  1 Term 2017 [redacted]w[redacted]d  4026 g M Vag-Spont   LIV    Obstetric Comments  H/o episiotomy in 1st labor    Social History   Socioeconomic History  . Marital status: Married    Spouse name: [redacted]w[redacted]d  . Number of children: Not on file  . Years of education: Not on file  . Highest education level: Not on file  Occupational History  . Not on file  Tobacco Use  . Smoking status: Never Smoker  . Smokeless tobacco: Never Used  Vaping Use  . Vaping Use: Never used  Substance and Sexual  Activity  . Alcohol use: Never  . Drug use: Never  . Sexual activity: Yes    Birth control/protection: None  Other Topics Concern  . Not on file  Social History Narrative  . Not on file   Social Determinants of Health   Financial Resource Strain: Not on file  Food Insecurity: Not on file  Transportation Needs: Not on file  Physical Activity: Not on file  Stress: Not on file  Social Connections: Not on file    Family History  Problem Relation Age of Onset  . Hypertension Mother   . Healthy Father     Medications Prior to Admission  Medication Sig Dispense Refill Last Dose  . Prenatal Vit-Fe Fumarate-FA (MULTIVITAMIN-PRENATAL) 27-0.8 MG TABS tablet Take 1 tablet by mouth daily at 12 noon.   12/26/2020 at Unknown time    Allergies  Allergen Reactions  . Penicillins Hives    Review of Systems: Negative except for what is mentioned in HPI.  Physical Exam: BP (!) 96/59   Pulse 89   Temp 97.9 F (36.6 C) (Oral)   Resp 17   Ht 5\' 3"  (1.6 m)   Wt 75.3 kg   LMP 03/16/2020   SpO2 97%   BMI 29.41 kg/m  CONSTITUTIONAL: Well-developed, well-nourished female in  no acute distress.  HENT:  Normocephalic, atraumatic, External right and left ear normal. Oropharynx is clear and moist EYES: Conjunctivae and EOM are normal. Pupils are equal, round, and reactive to light. No scleral icterus.  NECK: Normal range of motion, supple, no masses SKIN: Skin is warm and dry. No rash noted. Not diaphoretic. No erythema. No pallor. NEUROLOGIC: Alert and oriented to person, place, and time. Normal reflexes, muscle tone coordination. No cranial nerve deficit noted. PSYCHIATRIC: Normal mood and affect. Normal behavior. Normal judgment and thought content. CARDIOVASCULAR: Normal heart rate noted, regular rhythm RESPIRATORY: Effort and breath sounds normal, no problems with respiration noted ABDOMEN: Soft, nontender, nondistended, gravid. MUSCULOSKELETAL: Normal range of motion. No edema and no  tenderness. 2+ distal pulses.  Cervical Exam: Dilatation 9cm   Effacement 100%   Station 0 to +1 (previously 6/80/-2  Presentation: cephalic FHT:  Baseline rate 135 bpm   Variability moderate  Accelerations present   Decelerations none Contractions: Every 2-4 mins   Pertinent Labs/Studies:   Results for orders placed or performed during the hospital encounter of 12/26/20 (from the past 24 hour(s))  Resp Panel by RT-PCR (Flu A&B, Covid) Nasopharyngeal Swab     Status: None   Collection Time: 12/26/20  5:05 PM   Specimen: Nasopharyngeal Swab; Nasopharyngeal(NP) swabs in vial transport medium  Result Value Ref Range   SARS Coronavirus 2 by RT PCR NEGATIVE NEGATIVE   Influenza A by PCR NEGATIVE NEGATIVE   Influenza B by PCR NEGATIVE NEGATIVE  CBC     Status: Abnormal   Collection Time: 12/26/20  5:05 PM  Result Value Ref Range   WBC 15.9 (H) 4.0 - 10.5 K/uL   RBC 4.75 3.87 - 5.11 MIL/uL   Hemoglobin 14.1 12.0 - 15.0 g/dL   HCT 00.8 67.6 - 19.5 %   MCV 85.1 80.0 - 100.0 fL   MCH 29.7 26.0 - 34.0 pg   MCHC 34.9 30.0 - 36.0 g/dL   RDW 09.3 26.7 - 12.4 %   Platelets 253 150 - 400 K/uL   nRBC 0.0 0.0 - 0.2 %  Type and screen     Status: None   Collection Time: 12/26/20  5:05 PM  Result Value Ref Range   ABO/RH(D) O POS    Antibody Screen NEG    Sample Expiration      12/29/2020,2359 Performed at [facility], [address] [place], [place] [address]     Assessment : Eisha Chatterjee is a 25 y.o. G3P2002 at [redacted]w[redacted]d being admitted for labor.  GBS negative. History of fetal macrsomia in first pregnancy. Suspected wnl EFW for current pregnancy.    Plan: Labor: Admission labs ordered. Augmentation as needed per protocol. AROM'd with clear fluid. Is s/p epidural placement.  FWB: Reassuring fetal heart tracing.  GBS negative Delivery plan: Hopeful for vaginal delivery    Hildred Laser, MD [facility]

## 2020-12-27 ENCOUNTER — Encounter: Payer: Self-pay | Admitting: Obstetrics and Gynecology

## 2020-12-27 LAB — RPR: RPR Ser Ql: NONREACTIVE

## 2020-12-27 LAB — CBC
HCT: 36.6 % (ref 36.0–46.0)
Hemoglobin: 12.6 g/dL (ref 12.0–15.0)
MCH: 29.8 pg (ref 26.0–34.0)
MCHC: 34.4 g/dL (ref 30.0–36.0)
MCV: 86.5 fL (ref 80.0–100.0)
Platelets: 202 10*3/uL (ref 150–400)
RBC: 4.23 MIL/uL (ref 3.87–5.11)
RDW: 13.9 % (ref 11.5–15.5)
WBC: 17.4 10*3/uL — ABNORMAL HIGH (ref 4.0–10.5)
nRBC: 0 % (ref 0.0–0.2)

## 2020-12-27 MED ORDER — SIMETHICONE 80 MG PO CHEW: 80.0000 mg | CHEWABLE_TABLET | ORAL | Status: DC | PRN

## 2020-12-27 MED ORDER — ONDANSETRON HCL 4 MG PO TABS: 4.0000 mg | ORAL_TABLET | ORAL | Status: DC | PRN

## 2020-12-27 MED ORDER — SENNOSIDES-DOCUSATE SODIUM 8.6-50 MG PO TABS
2.0000 | ORAL_TABLET | Freq: Every day | ORAL | Status: DC
  Administered 2020-12-27: 2 via ORAL
  Filled 2020-12-27: qty 2

## 2020-12-27 MED ORDER — ZOLPIDEM TARTRATE 5 MG PO TABS: 5.0000 mg | ORAL_TABLET | Freq: Every evening | ORAL | Status: DC | PRN

## 2020-12-27 MED ORDER — PRENATAL MULTIVITAMIN CH
1.0000 | ORAL_TABLET | Freq: Every day | ORAL | Status: DC
  Administered 2020-12-27: 1 via ORAL
  Filled 2020-12-27: qty 1

## 2020-12-27 MED ORDER — DIPHENHYDRAMINE HCL 25 MG PO CAPS: 25.0000 mg | ORAL_CAPSULE | Freq: Four times a day (QID) | ORAL | Status: DC | PRN

## 2020-12-27 MED ORDER — ONDANSETRON HCL 4 MG/2ML IJ SOLN: 4.0000 mg | INTRAMUSCULAR | Status: DC | PRN

## 2020-12-27 MED ORDER — COCONUT OIL OIL
1.0000 "application " | TOPICAL_OIL | Status: DC | PRN
  Administered 2020-12-27: 1 via TOPICAL
  Filled 2020-12-27: qty 120

## 2020-12-27 NOTE — Anesthesia Postprocedure Evaluation (Signed)
Anesthesia Post Note  Patient: Emily Farley  Procedure(s) Performed: AN AD HOC LABOR EPIDURAL  Patient location during evaluation: Mother Baby Anesthesia Type: Epidural Level of consciousness: awake and alert Pain management: pain level controlled Vital Signs Assessment: post-procedure vital signs reviewed and stable Respiratory status: spontaneous breathing, nonlabored ventilation and respiratory function stable Cardiovascular status: stable Postop Assessment: no headache, no backache, patient able to bend at knees and able to ambulate Anesthetic complications: no   No complications documented.   Last Vitals:  Vitals:   12/27/20 0501 12/27/20 0810  BP: 95/72 93/64  Pulse: 85 79  Resp: 20 18  Temp: 36.6 C 36.4 C  SpO2: 97%     Last Pain:  Vitals:   12/27/20 0810  TempSrc: Oral  PainSc:                  Cleda Mccreedy Agapito [place]way

## 2020-12-27 NOTE — Progress Notes (Signed)
Post Partum Day # 1, s/p SVD  Subjective: no complaints, up ad lib, voiding and tolerating PO  Objective: Temp:  [97.6 F (36.4 C)-98.5 F (36.9 C)] 97.6 F (36.4 C) (04/10 0810) Pulse Rate:  [74-112] 79 (04/10 0810) Resp:  [17-20] 18 (04/10 0810) BP: (80-109)/(40-78) 93/64 (04/10 0810) SpO2:  [90 %-100 %] 97 % (04/10 0501) Weight:  [75.3 kg] 75.3 kg (04/09 1649)  Physical Exam:  General: alert and no distress  Lungs: clear to auscultation bilaterally Breasts: normal appearance, no masses or tenderness Heart: regular rate and rhythm, S1, S2 normal, no murmur, click, rub or gallop Abdomen: soft, non-tender; bowel sounds normal; no masses,  no organomegaly Pelvis: Lochia: appropriate, Uterine Fundus: firm Extremities: DVT Evaluation: No evidence of DVT seen on physical exam. Negative Homan's sign. No cords or calf tenderness.  Recent Labs    12/26/20 1705 12/27/20 0751  HGB 14.1 12.6  HCT 40.4 36.6    Assessment/Plan: Doing well postpartum Breastfeeding  Contraception undecided Plan for discharge tomorrow   LOS: 1 day   Hildred Laser, MD [facility] 12/27/2020 9:11 AM

## 2020-12-28 MED ORDER — IBUPROFEN 600 MG PO TABS: 600.0000 mg | ORAL_TABLET | Freq: Four times a day (QID) | ORAL | 0 refills | Status: DC | PRN

## 2020-12-28 NOTE — Progress Notes (Signed)
Patient discharged home with infant. Rx sent to pharmacy. PP instructions given and reviewed with patient. Patient verbalized understanding. Patient escorted out by auxiliary.   Peter Minium 12/28/2020 11:27 AM

## 2020-12-28 NOTE — Discharge Instructions (Signed)
Storing Breast Milk Breast milk has infection-fighting cells (antibodies) in it. Breast milk is a fresh, living food that can go bad if not stored properly. Expressed breast milk, by pumping or by hand, needs to be stored in a certain way so that it remains effective and safe in helping to protect your baby against infections and to help them grow. The following guidelines are for storing breast milk for a healthy, full-term infant. It is important to wash your hands with soap and water for at least 20 seconds before and after expressing and storing breast milk. If soap and water are not available, use hand sanitizer. What are the risks? There are no risks in storing breastmilk if done according to the guidelines in this document. However, frozen milk, once thawed, can smell different from fresh milk. This does not mean that the milk is bad and cannot be fed to the baby. Many babies will accept this milk just fine. Contact a health care provider or lactation specialist if you notice that your thawed breastmilk smells different and your baby refuses to drink it. How to store breast milk  Milk may be stored in: ? A glass container. ? A hard, BPA-free plastic container. ? A plastic bag designed for storing breast milk. Many women like using plastic bags because they take up less space than other containers and can be attached directly to the breast pump.  Store your milk in 2-4 oz (60-120 mL) servings. This makes it easier to thaw the milk. It also helps you to avoid having to throw out milk that your baby does not drink.  Leave an inch or so (about 2.5 cm) at the top of the bag or bottle so the milk has room to expand as it freezes.  Label each container with the date and time the milk was expressed. This will help you to use the milk in the order that it was expressed. Use the "first in, first out" method when using stored milk.  If you will be freezing your breast milk, freeze it within 24 hours  of expressing. Store it in the back of the freezer to prevent the milk from being affected by temperature changes when the freezer door is opened. You do not have to freeze all of your pumped milk. It is best to use the milk fresh, while being stored in the refrigerator for up to 4 days.   How long can breast milk be stored?  The amount of time within which expressed breastmilk can be used safely depends on how it is stored.  Milk can be stored for up to 4 hours at room temperature, or 60-50F (15.6-19.4C). However, it is acceptable to allow the milk to sit for 6-8 hours at lower temperatures if the pump parts and containers are well cleaned and you have washed your hands well before expressing breast milk.  Milk can be stored for up to 4 days in a refrigerator at less than 32F (3.9C). However, it is acceptable to allow the milk to be refrigerated for up to 5-8 days if the pump parts and containers are well cleaned. It is best to store milk towards the back of the refrigerator. If you are storing milk at work, it is OK to store it in a shared refrigerator.  Milk can be stored for 2 weeks in a freezer compartment inside a refrigerator.  Milk can be stored for up to 6 months in a freezer compartment of a refrigerator with a  separate door.  Milk can be stored for 6-12 months in a deep freezer at -62F (-20C). A deep freezer is a chest or stand-alone freezer that is not opened very often and is kept at a colder temperature than a regular freezer.  Milk left over from a feeding should be used within 2 hours. After 2 hours, throw away any leftover milk in the bottle. How to thaw frozen breast milk  Frozen milk can be thawed: ? In a refrigerator. ? Under warm, running tap water for up to 15 minutes. ? In a pan of warm water that has been heated on the stove. Let it sit for up to 15 minutes. Place the container or bag in the pan of water.  Do not heat milk directly on the stove or in the microwave.  The milk will heat unevenly, and it may burn the baby's mouth. It will also destroy some of the milk's infection-fighting properties. Can I refreeze thawed milk? Do not refreeze thawed milk. You can store thawed milk in the refrigerator for up to 24 hours. Can I add freshly pumped milk to frozen milk? If you want to freeze freshly expressed milk along with milk that is already frozen, take these steps to prevent the fresh milk from thawing out the frozen milk:  Chill the fresh milk in the refrigerator for 30 minutes before adding it to the frozen milk.  Make sure that the amount of fresh milk you add is less than the amount of frozen milk. Where to find more information  Center for Disease Control: FootballExhibition.com.br  La Leche League: www.llli.org  American Academy of Pediatrics: www.healthychildren.org Summary  Breast milk has infection-fighting cells (antibodies) in it. Expressed breast milk needs to be stored in a certain way.  The amount of time within which expressed breastmilk can be used safely depends on how it is stored.  Thaw breast milk safely either in the refrigerator, in warm water, or under warm running tap water. Do not heat milk directly on the stove or in the microwave.  Do not refreeze thawed breast milk. Use thawed breast milk stored in the refrigerator within 24 hours. This information is not intended to replace advice given to you by your health care provider. Make sure you discuss any questions you have with your health care provider. Document Revised: 02/25/2020 Document Reviewed: 02/25/2020 Elsevier Patient Education  2021 Elsevier Inc. SIDS Prevention Information Sudden infant death syndrome (SIDS) is the sudden death of a healthy baby that cannot be explained. The cause of SIDS is not known, but it usually happens when a baby is asleep. There are steps that you can take to help prevent SIDS. What actions can I take to prevent this? Sleeping  Always put your baby  on his or her back for naptime and bedtime. Do this until your baby is 89 year old. Sleeping this way has the lowest risk of SIDS. Do not put your baby to sleep on his or her side or stomach unless your baby's doctor tells you to do so.  Put your baby to sleep in a crib or bassinet that is close to the bed of a parent or caregiver. This is the safest place for a baby to sleep.  Use a crib and crib mattress that have been approved for safety by the [employer] and the [employer] for [employer] operational officer. ? Use a firm crib mattress with a fitted sheet. Make sure there are no gaps larger  than two fingers between the sides of the crib and the mattress. ? Do not put any of these things in the crib:  Loose bedding.  Quilts.  Duvets.  Sheepskins.  Crib rail bumpers.  Pillows.  Toys.  Stuffed animals. ? Do not put your baby to sleep in an infant carrier, car seat, stroller, or swing.  Do not let your child sleep in the same bed as other people.  Do not put more than one baby to sleep in a crib or bassinet. If you have more than one baby, they should each have their own sleeping area.  Do not put your baby to sleep on an adult bed, a soft mattress, a sofa, a waterbed, or cushions.  Do not let your baby get hot while sleeping. Dress your baby in light clothing, such as a one-piece sleeper. Your baby should not feel hot to the touch and should not be sweaty.  Do not cover your baby or your baby's head with blankets while sleeping.   Feeding  Breastfeed your baby. Babies who breastfeed wake up more easily. They also have a lower risk of breathing problems during sleep.  If you bring your baby into bed for a feeding, make sure you put him or her back into the crib after the feeding. General instructions  Think about using a pacifier. A pacifier may help lower the risk of SIDS. Talk to your doctor about the best way to start using a pacifier with your baby. If  you use one: ? It should be dry. ? Clean it regularly. ? Do not attach it to any strings or objects if your baby uses it while sleeping. ? Do not put the pacifier back into your baby's mouth if it falls out while he or she is asleep.  Do not smoke or use tobacco around your baby. This is very important when he or she is sleeping. If you smoke or use tobacco when you are not around your baby or when outside of your home, change your clothes and bathe before being around your baby. Keep your car and home smoke-free.  Give your baby plenty of time on his or her tummy while he or she is awake and while you can watch. This helps: ? Your baby's muscles. ? Your baby's nervous system. ? To keep the back of your baby's head from becoming flat.  Keep your baby up to date with all of his or her shots (vaccines).   Where to find more information  American Academy of Pediatrics: BridgeDigest.com.cy  [employer]: safetosleep.https://www.frey.org/  [employer] Commission: https://www.rangel.com/ Summary  Sudden infant death syndrome (SIDS) is the sudden death of a healthy baby that cannot be explained.  The cause of SIDS is not known. There are steps that you can take to help prevent SIDS.  Always put your baby on his or her back for naptime and bedtime until your baby is 81 year old.  Have your baby sleep in a crib or bassinet that is close to the bed of a parent or caregiver. Make sure the crib or bassinet is approved for safety.  Make sure all soft objects, toys, blankets, pillows, loose bedding, sheepskins, and crib bumpers are kept out of your baby's sleep area. This information is not intended to replace advice given to you by your health care provider. Make sure you discuss any questions you have with your health care provider. Document Revised: 04/24/2020 Document Reviewed: 04/24/2020 Elsevier Patient  Education  2021 Elsevier Inc. Postpartum Care After Vaginal Delivery The  following information offers guidance about how to care for yourself from the time you deliver your baby to 6-12 weeks after delivery (postpartum period). If you have problems or questions, contact your health care provider for more specific instructions. Follow these instructions at home: Vaginal bleeding  It is normal to have vaginal bleeding (lochia) after delivery. Wear a sanitary pad for bleeding and discharge. ? During the first week after delivery, the amount and appearance of lochia is often similar to a menstrual period. ? Over the next few weeks, it will gradually decrease to a dry, yellow-brown discharge. ? For most women, lochia stops completely by 4-6 weeks after delivery, but can vary.  Change your sanitary pads frequently. Watch for any changes in your flow, such as: ? A sudden increase in volume. ? A change in color. ? Large blood clots.  If you pass a blood clot from your vagina, save it and call your health care provider. Do not flush blood clots down the toilet before talking with your health care provider.  Do not use tampons or douches until your health care provider approves.  If you are not breastfeeding, your period should return 6-8 weeks after delivery. If you are feeding your baby breast milk only, your period may not return until you stop breastfeeding. Perineal care  Keep the area between the vagina and the anus (perineum) clean and dry. Use medicated pads and pain-relieving sprays and creams as directed.  If you had a surgical cut in the perineum (episiotomy) or a tear, check the area for signs of infection until you are healed. Check for: ? More redness, swelling, or pain. ? Fluid or blood coming from the cut or tear. ? Warmth. ? Pus or a bad smell.  You may be given a squirt bottle to use instead of wiping to clean the perineum area after you use the bathroom. Pat the area gently to dry it.  To relieve pain caused by an episiotomy, a tear, or swollen  veins in the anus (hemorrhoids), take a warm sitz bath 2-3 times a day. In a sitz bath, the warm water should only come up to your hips and cover your buttocks.   Breast care  In the first few days after delivery, your breasts may feel heavy, full, and uncomfortable (breast engorgement). Milk may also leak from your breasts. Ask your health care provider about ways to help relieve the discomfort.  If you are breastfeeding: ? Wear a bra that supports your breasts and fits well. Use breast pads to absorb milk that leaks. ? Keep your nipples clean and dry. Apply creams and ointments as told. ? You may have uterine contractions every time you breastfeed for up to several weeks after delivery. This helps your uterus return to its normal size. ? If you have any problems with breastfeeding, notify your health care provider or lactation consultant.  If you are not breastfeeding: ? Avoid touching your breasts. Do not squeeze out (express) milk. Doing this can make your breasts produce more milk. ? Wear a good-fitting bra and use cold packs to help with swelling. Intimacy and sexuality  Ask your health care provider when you can engage in sexual activity. This may depend upon: ? Your risk of infection. ? How fast you are healing. ? Your comfort and desire to engage in sexual activity.  You are able to get pregnant after delivery, even if you have  not had your period. Talk with your health care provider about methods of birth control (contraception) or family planning if you desire future pregnancies. Medicines  Take over-the-counter and prescription medicines only as told by your health care provider.  Take an over-the-counter stool softener to help ease bowel movements as told by your health care provider.  If you were prescribed an antibiotic medicine, take it as told by your health care provider. Do not stop taking the antibiotic even if you start to feel better.  Review all previous and  current prescriptions to check for possible transfer into breast milk. Activity  Gradually return to your normal activities as told by your health care provider.  Rest as much as possible. Nap while your baby is sleeping. Eating and drinking  Drink enough fluid to keep your urine pale yellow.  To help prevent or relieve constipation, eat high-fiber foods every day.  Choose healthy eating to support breastfeeding or weight loss goals.  Take your prenatal vitamins until your health care provider tells you to stop.   General tips/recommendations  Do not use any products that contain nicotine or tobacco. These products include cigarettes, chewing tobacco, and vaping devices, such as e-cigarettes. If you need help quitting, ask your health care provider.  Do not drink alcohol, especially if you are breastfeeding.  Do not take medications or drugs that are not prescribed to you, especially if you are breastfeeding.  Visit your health care provider for a postpartum checkup within the first 3-6 weeks after delivery.  Complete a comprehensive postpartum visit no later than 12 weeks after delivery.  Keep all follow-up visits for you and your baby. Contact a health care provider if:  You feel unusually sad or worried.  Your breasts become red, painful, or hard.  You have a fever or other signs of an infection.  You have bleeding that is soaking through one pad an hour or you have blood clots.  You have a severe headache that doesn't go away or you have vision changes.  You have nausea and vomiting and are unable to eat or drink anything for 24 hours. Get help right away if:  You have chest pain or difficulty breathing.  You have sudden, severe leg pain.  You faint or have a seizure.  You have thoughts about hurting yourself or your baby. If you ever feel like you may hurt yourself or others, or have thoughts about taking your own life, get help right away. Go to your nearest  emergency department or:  Call your local emergency services (911 in the [place]  The National Suicide Prevention Lifeline at (225) 273-7258. This suicide crisis helpline is open 24 hours a day.  Text the Crisis Text Line at 807-758-9704 (in the [place] Summary  The period of time after you deliver your newborn up to 6-12 weeks after delivery is called the postpartum period.  Keep all follow-up visits for you and your baby.  Review all previous and current prescriptions to check for possible transfer into breast milk.  Contact a health care provider if you feel unusually sad or worried during the postpartum period. This information is not intended to replace advice given to you by your health care provider. Make sure you discuss any questions you have with your health care provider. Document Revised: 05/21/2020 Document Reviewed: 05/21/2020 Elsevier Patient Education  2021 Elsevier Inc. Postpartum Baby Blues The postpartum period begins right after the birth of a baby. During this time, there is often joy  and excitement. It is also a time of many changes in the life of the parents. A mother may feel happy one minute and sad or stressed the next. These feelings of sadness, called the baby blues, usually happen in the period right after the baby is born and go away within a week or two. What are the causes? The exact cause of this condition is not known. Changes in hormone levels after childbirth are believed to trigger some of the symptoms. Other factors that can play a role in these mood changes include:  Lack of sleep.  Stressful life events, such as financial problems, caring for a loved one, or death of a loved one.  Genetics. What are the signs or symptoms? Symptoms of this condition include:  Changes in mood, such as going from extreme happiness to sadness.  A decrease in concentration.  Difficulty sleeping.  Crying spells and tearfulness.  Loss of  appetite.  Irritability.  Anxiety. If these symptoms last for more than 2 weeks or become more severe, you may have postpartum depression. How is this diagnosed? This condition is diagnosed based on an evaluation of your symptoms. Your health care provider may use a screening tool that includes a list of questions to help identify a person with the baby blues or postpartum depression. How is this treated? The baby blues usually go away on their own in 1-2 weeks. Social support is often what is needed. You will be encouraged to get adequate sleep and rest. Follow these instructions at home: Lifestyle  Get as much rest as you can. Take a nap when the baby sleeps.  Exercise regularly as told by your health care provider. Some women find yoga and walking to be helpful.  Eat a balanced and nourishing diet. This includes plenty of fruits and vegetables, whole grains, and lean proteins.  Do little things that you enjoy. Take a bubble bath, read your favorite magazine, or listen to your favorite music.  Avoid alcohol.  Ask for help with household chores, cooking, grocery shopping, or running errands. Do not try to do everything yourself. Consider hiring a postpartum doula to help. This is a professional who specializes in providing support to new mothers.  Try not to make any major life changes during pregnancy or right after giving birth. This can add stress.      General instructions  Talk to people close to you about how you are feeling. Get support from your partner, family members, friends, or other new moms. You may want to join a support group.  Find ways to manage stress. This may include: ? Writing your thoughts and feelings in a journal. ? Spending time outside. ? Spending time with people who make you laugh.  Try to stay positive in how you think. Think about the things you are grateful for.  Take over-the-counter and prescription medicines only as told by your health care  provider.  Let your health care provider know if you have any concerns.  Keep all postpartum visits. This is important. Contact a health care provider if:  Your baby blues do not go away after 2 weeks. Get help right away if:  You have thoughts of taking your own life (suicidal thoughts), or of harming your baby or someone else.  You see or hear things that are not there (hallucinations). If you ever feel like you may hurt yourself or others, or have thoughts about taking your own life, get help right away. Go to your  nearest emergency department or:  Call your local emergency services (911 in the [place]  Call a suicide crisis helpline, such as the National Suicide Prevention Lifeline, at 904 685 6489. This is open 24 hours a day in the [place]  Text the Crisis Text Line at 440-237-4803 (in the [place] Summary  After giving birth, you may feel happy one minute and sad or stressed the next. Feelings of sadness that happen right after the baby is born and go away after a week or two are called the baby blues.  You can manage the baby blues by getting enough rest, eating a healthy diet, exercising, spending time with supportive people, and finding ways to manage stress.  If feelings of sadness and stress last longer than 2 weeks or get in the way of caring for your baby, talk with your health care provider. This may mean you have postpartum depression. This information is not intended to replace advice given to you by your health care provider. Make sure you discuss any questions you have with your health care provider. Document Revised: 02/28/2020 Document Reviewed: 02/28/2020 Elsevier Patient Education  2021 Elsevier Inc. Breastfeeding Tips for a Good Latch Latching is how your baby's mouth attaches to your nipple to breastfeed. It is an important part of breastfeeding. Your baby may have trouble latching for a number of reasons, such as:  Not being in the right position.  Using a bottle  or pacifier too early.  Problems within your baby's mouth, tongue, or lips.  The shape of your nipples.  Your baby being born early (prematurely). Small babies often have a weak suck.  Breasts becoming overfilled with milk (engorged breasts).  Express a little milk to help soften the breast. Work with a breastfeeding specialist (Advertising copywriter) to help your baby have a good latch. How does this affect me? A poor latch may cause you to have problems such as:  Cracked nipples.  Sore nipples.  Breasts becoming overfilled with milk  Plugged milk ducts.  Low milk supply.  Breast inflammation.  Breast infection. How does this affect my baby? A poor latch may cause your baby to not be able to feed well. As a result, he or she may have trouble gaining weight. Follow these instructions at home: How to position your baby  Find a comfortable place to sit or lie down. Your neck and back should be well supported.  If you are seated, place a pillow or rolled-up blanket under your baby. This will bring him or her to the level of your breast.  Make sure that your baby's belly is facing your belly.  Try different positions to find one that works best for you and your baby. How to help your baby latch  To start, you might find it helpful to gently rub your breast. Move your fingertips in a circle as you massage from your chest wall toward your nipple. This helps milk flow. Keep doing this during feeding if needed.  Position your breast. Hold your breast with four fingers underneath and your thumb above your nipple. Keep your fingers away from your nipple and your baby's mouth. Follow these steps to help your baby latch: 1. Rub your baby's lips gently with your finger or nipple. 2. When your baby's mouth is open wide enough, quickly bring your baby to your breast and place your whole nipple into your baby's mouth. Place as much of the colored area around your nipple (areola)as  possible into your baby's mouth. 3.  Your baby's tongue should be between his or her lower gum and your breast. 4. You should be able to see more areola above your baby's upper lip than below the lower lip. 5. When your baby starts sucking, you will feel a gentle pull on your nipple. You should not feel any pain. Be patient. It is common for a baby to suck for about 2-3 minutes to start the flow of breast milk. 6. Make sure that your baby's mouth is in the right position around your nipple. Your baby's lips should make a seal on your breast and be turned outward.   General instructions  Look for these signs that your baby has latched on to your nipple: ? The baby is quietly tugging or sucking without causing you pain. ? You hear the baby swallow after every 3 or 4 sucks. ? You see movement above and in front of the baby's ears while he or she is sucking.  Be aware of these signs that your baby has not latched on to your nipple: ? The baby makes sucking sounds or smacking sounds while feeding. ? You have nipple pain.  If your baby is not latched well, put your little finger between your baby's gums and your nipple. This will break the seal. Then try to help your baby latch again.  If you need help, get help from a breastfeeding specialist. Contact a doctor if:  You have cracking or soreness in your nipples that lasts longer than 1 week.  You have nipple pain.  Your breasts are filled with too much milk (engorgement), and this does not improve after 48-72 hours.  You have a plugged milk duct and a fever.  You follow the tips for a good latch but need more help.  You have a pus-like fluid coming from your breast.  Your baby is not gaining weight.  Your baby loses weight. Summary  Latching is how your baby's mouth attaches to your nipple to breastfeed.  Try different positions for breastfeeding to find one that works best for you and your baby.  A poor latch may cause you to have  cracked or sore nipples or other problems.  Work with a breastfeeding specialist (Advertising copywriter) to help your baby have a good latch. This information is not intended to replace advice given to you by your health care provider. Make sure you discuss any questions you have with your health care provider. Document Revised: 03/04/2020 Document Reviewed: 03/04/2020 Elsevier Patient Education  2021 [employer] Breastfeeding  Choosing to breastfeed is one of the best decisions you can make for yourself and your baby. A change in hormones during pregnancy causes your breasts to make breast milk in your milk-producing glands. Hormones prevent breast milk from being released before your baby is born. They also prompt milk flow after birth. Once breastfeeding has begun, thoughts of your baby, as well as his or her sucking or crying, can stimulate the release of milk from your milk-producing glands. Benefits of breastfeeding Research shows that breastfeeding offers many health benefits for infants and mothers. It also offers a cost-free and convenient way to feed your baby. For your baby  Your first milk (colostrum) helps your baby's digestive system to function better.  Special cells in your milk (antibodies) help your baby to fight off infections.  Breastfed babies are less likely to develop asthma, allergies, obesity, or type 2 diabetes. They are also at lower risk for sudden infant death syndrome (SIDS).  Nutrients  in breast milk are better able to meet your baby's needs compared to infant formula.  Breast milk improves your baby's brain development. For you  Breastfeeding helps to create a very special bond between you and your baby.  Breastfeeding is convenient. Breast milk costs nothing and is always available at the correct temperature.  Breastfeeding helps to burn calories. It helps you to lose the weight that you gained during pregnancy.  Breastfeeding makes your uterus return  faster to its size before pregnancy. It also slows bleeding (lochia) after you give birth.  Breastfeeding helps to lower your risk of developing type 2 diabetes, osteoporosis, rheumatoid arthritis, cardiovascular disease, and breast, ovarian, uterine, and endometrial cancer later in life. Breastfeeding basics Starting breastfeeding  Find a comfortable place to sit or lie down, with your neck and back well-supported.  Place a pillow or a rolled-up blanket under your baby to bring him or her to the level of your breast (if you are seated). Nursing pillows are specially designed to help support your arms and your baby while you breastfeed.  Make sure that your baby's tummy (abdomen) is facing your abdomen.  Gently massage your breast. With your fingertips, massage from the outer edges of your breast inward toward the nipple. This encourages milk flow. If your milk flows slowly, you may need to continue this action during the feeding.  Support your breast with 4 fingers underneath and your thumb above your nipple (make the letter "C" with your hand). Make sure your fingers are well away from your nipple and your baby's mouth.  Stroke your baby's lips gently with your finger or nipple.  When your baby's mouth is open wide enough, quickly bring your baby to your breast, placing your entire nipple and as much of the areola as possible into your baby's mouth. The areola is the colored area around your nipple. ? More areola should be visible above your baby's upper lip than below the lower lip. ? Your baby's lips should be opened and extended outward (flanged) to ensure an adequate, comfortable latch. ? Your baby's tongue should be between his or her lower gum and your breast.  Make sure that your baby's mouth is correctly positioned around your nipple (latched). Your baby's lips should create a seal on your breast and be turned out (everted).  It is common for your baby to suck about 2-3 minutes in  order to start the flow of breast milk. Latching Teaching your baby how to latch onto your breast properly is very important. An improper latch can cause nipple pain, decreased milk supply, and poor weight gain in your baby. Also, if your baby is not latched onto your nipple properly, he or she may swallow some air during feeding. This can make your baby fussy. Burping your baby when you switch breasts during the feeding can help to get rid of the air. However, teaching your baby to latch on properly is still the best way to prevent fussiness from swallowing air while breastfeeding. Signs that your baby has successfully latched onto your nipple  Silent tugging or silent sucking, without causing you pain. Infant's lips should be extended outward (flanged).  Swallowing heard between every 3-4 sucks once your milk has started to flow (after your let-down milk reflex occurs).  Muscle movement above and in front of his or her ears while sucking. Signs that your baby has not successfully latched onto your nipple  Sucking sounds or smacking sounds from your baby while breastfeeding.  Nipple pain. If you think your baby has not latched on correctly, slip your finger into the corner of your baby's mouth to break the suction and place it between your baby's gums. Attempt to start breastfeeding again. Signs of successful breastfeeding Signs from your baby  Your baby will gradually decrease the number of sucks or will completely stop sucking.  Your baby will fall asleep.  Your baby's body will relax.  Your baby will retain a small amount of milk in his or her mouth.  Your baby will let go of your breast by himself or herself. Signs from you  Breasts that have increased in firmness, weight, and size 1-3 hours after feeding.  Breasts that are softer immediately after breastfeeding.  Increased milk volume, as well as a change in milk consistency and color by the fifth day of  breastfeeding.  Nipples that are not sore, cracked, or bleeding. Signs that your baby is getting enough milk  Wetting at least 1-2 diapers during the first 24 hours after birth.  Wetting at least 5-6 diapers every 24 hours for the first week after birth. The urine should be clear or pale yellow by the age of 5 days.  Wetting 6-8 diapers every 24 hours as your baby continues to grow and develop.  At least 3 stools in a 24-hour period by the age of 5 days. The stool should be soft and yellow.  At least 3 stools in a 24-hour period by the age of 7 days. The stool should be seedy and yellow.  No loss of weight greater than 10% of birth weight during the first 3 days of life.  Average weight gain of 4-7 oz (113-198 g) per week after the age of 4 days.  Consistent daily weight gain by the age of 5 days, without weight loss after the age of 2 weeks. After a feeding, your baby may spit up a small amount of milk. This is normal. Breastfeeding frequency and duration Frequent feeding will help you make more milk and can prevent sore nipples and extremely full breasts (breast engorgement). Breastfeed when you feel the need to reduce the fullness of your breasts or when your baby shows signs of hunger. This is called "breastfeeding on demand." Signs that your baby is hungry include:  Increased alertness, activity, or restlessness.  Movement of the head from side to side.  Opening of the mouth when the corner of the mouth or cheek is stroked (rooting).  Increased sucking sounds, smacking lips, cooing, sighing, or squeaking.  Hand-to-mouth movements and sucking on fingers or hands.  Fussing or crying. Avoid introducing a pacifier to your baby in the first 4-6 weeks after your baby is born. After this time, you may choose to use a pacifier. Research has shown that pacifier use during the first year of a baby's life decreases the risk of sudden infant death syndrome (SIDS). Allow your baby to feed  on each breast as long as he or she wants. When your baby unlatches or falls asleep while feeding from the first breast, offer the second breast. Because newborns are often sleepy in the first few weeks of life, you may need to awaken your baby to get him or her to feed. Breastfeeding times will vary from baby to baby. However, the following rules can serve as a guide to help you make sure that your baby is properly fed:  Newborns (babies 47 weeks of age or younger) may breastfeed every 1-3 hours.  Newborns  should not go without breastfeeding for longer than 3 hours during the day or 5 hours during the night.  You should breastfeed your baby a minimum of 8 times in a 24-hour period. Breast milk pumping Pumping and storing breast milk allows you to make sure that your baby is exclusively fed your breast milk, even at times when you are unable to breastfeed. This is especially important if you go back to work while you are still breastfeeding, or if you are not able to be present during feedings. Your lactation consultant can help you find a method of pumping that works best for you and give you guidelines about how long it is safe to store breast milk.      Caring for your breasts while you breastfeed Nipples can become dry, cracked, and sore while breastfeeding. The following recommendations can help keep your breasts moisturized and healthy:  Avoid using soap on your nipples.  Wear a supportive bra designed especially for nursing. Avoid wearing underwire-style bras or extremely tight bras (sports bras).  Air-dry your nipples for 3-4 minutes after each feeding.  Use only cotton bra pads to absorb leaked breast milk. Leaking of breast milk between feedings is normal.  Use lanolin on your nipples after breastfeeding. Lanolin helps to maintain your skin's normal moisture barrier. Pure lanolin is not harmful (not toxic) to your baby. You may also hand express a few drops of breast milk and gently  massage that milk into your nipples and allow the milk to air-dry. In the first few weeks after giving birth, some women experience breast engorgement. Engorgement can make your breasts feel heavy, warm, and tender to the touch. Engorgement peaks within 3-5 days after you give birth. The following recommendations can help to ease engorgement:  Completely empty your breasts while breastfeeding or pumping. You may want to start by applying warm, moist heat (in the shower or with warm, water-soaked hand towels) just before feeding or pumping. This increases circulation and helps the milk flow. If your baby does not completely empty your breasts while breastfeeding, pump any extra milk after he or she is finished.  Apply ice packs to your breasts immediately after breastfeeding or pumping, unless this is too uncomfortable for you. To do this: ? Put ice in a plastic bag. ? Place a towel between your skin and the bag. ? Leave the ice on for 20 minutes, 2-3 times a day.  Make sure that your baby is latched on and positioned properly while breastfeeding. If engorgement persists after 48 hours of following these recommendations, contact your health care provider or a Advertising copywriter. Overall health care recommendations while breastfeeding  Eat 3 healthy meals and 3 snacks every day. Well-nourished mothers who are breastfeeding need an additional 450-500 calories a day. You can meet this requirement by increasing the amount of a balanced diet that you eat.  Drink enough water to keep your urine pale yellow or clear.  Rest often, relax, and continue to take your prenatal vitamins to prevent fatigue, stress, and low vitamin and mineral levels in your body (nutrient deficiencies).  Do not use any products that contain nicotine or tobacco, such as cigarettes and e-cigarettes. Your baby may be harmed by chemicals from cigarettes that pass into breast milk and exposure to secondhand smoke. If you need help  quitting, ask your health care provider.  Avoid alcohol.  Do not use illegal drugs or marijuana.  Talk with your health care provider before taking any medicines.  These include over-the-counter and prescription medicines as well as vitamins and herbal supplements. Some medicines that may be harmful to your baby can pass through breast milk.  It is possible to become pregnant while breastfeeding. If birth control is desired, ask your health care provider about options that will be safe while breastfeeding your baby. Where to find more information: [employer]: www.llli.org Contact a health care provider if:  You feel like you want to stop breastfeeding or have become frustrated with breastfeeding.  Your nipples are cracked or bleeding.  Your breasts are red, tender, or warm.  You have: ? Painful breasts or nipples. ? A swollen area on either breast. ? A fever or chills. ? Nausea or vomiting. ? Drainage other than breast milk from your nipples.  Your breasts do not become full before feedings by the fifth day after you give birth.  You feel sad and depressed.  Your baby is: ? Too sleepy to eat well. ? Having trouble sleeping. ? More than 71 week old and wetting fewer than 6 diapers in a 24-hour period. ? Not gaining weight by 24 days of age.  Your baby has fewer than 3 stools in a 24-hour period.  Your baby's skin or the white parts of his or her eyes become yellow. Get help right away if:  Your baby is overly tired (lethargic) and does not want to wake up and feed.  Your baby develops an unexplained fever. Summary  Breastfeeding offers many health benefits for infant and mothers.  Try to breastfeed your infant when he or she shows early signs of hunger.  Gently tickle or stroke your baby's lips with your finger or nipple to allow the baby to open his or her mouth. Bring the baby to your breast. Make sure that much of the areola is in your baby's mouth.  Offer one side and burp the baby before you offer the other side.  Talk with your health care provider or lactation consultant if you have questions or you face problems as you breastfeed. This information is not intended to replace advice given to you by your health care provider. Make sure you discuss any questions you have with your health care provider. Document Revised: 11/30/2017 Document Reviewed: 10/07/2016 Elsevier Patient Education  2021 [employer].

## 2020-12-28 NOTE — Discharge Summary (Signed)
Postpartum Discharge Summary     Patient Name: Emily Farley DOB: [birth year] MRN: [id number]  Date of admission: 12/26/2020 Delivery date:12/26/2020  Delivering provider: Rubie Maid  Date of discharge: 12/28/2020  Admitting diagnosis: Normal labor [O80, Z37.9] Intrauterine pregnancy: [redacted]w[redacted]d    Secondary diagnosis:  Active Problems:   Normal labor   Prior fetal macrosomia, antepartum, third trimester  Additional problems: None    Discharge diagnosis: Term Pregnancy Delivered                                              Post partum procedures:None Augmentation: AROM Complications: None  Hospital course: Onset of Labor With Vaginal Delivery      25y.o. yo G3P3003 at 387w6das admitted in Active Labor on 12/26/2020. Patient had an uncomplicated labor course as follows:  Membrane Rupture Time/Date: 7:35 PM ,12/26/2020   Delivery Method:Vaginal, Spontaneous  Episiotomy: None  Lacerations:  1st degree  Patient had an uncomplicated postpartum course.  She is ambulating, tolerating a regular diet, passing flatus, and urinating well. Patient is discharged home in stable condition on 12/28/20.  Newborn Data: Birth date:12/26/2020  Birth time:9:29 PM  Gender:Female  Living status:Living  Apgars:9 ,9  Weight:3370 g   Magnesium Sulfate received: No BMZ received: No Rhophylac:No MMR:No T-DaP:Given prenatally Flu: Given prenatally Transfusion:No  Physical exam  Vitals:   12/27/20 1221 12/27/20 1737 12/27/20 2349 12/28/20 0843  BP: 102/71 99/70 101/70 103/64  Pulse: 80 78 77 77  Resp: _0 Temp: 98 F (36.7 C) 98 F (36.7 C) 97.8 F (36.6 C) 97.6 F (36.4 C)  TempSrc:  Oral Oral Oral  SpO2: 98% 97% 98% 99%  Weight:      Height:       General: alert, cooperative and no distress Lochia: appropriate Uterine Fundus: firm Incision: N/A DVT Evaluation: No evidence of DVT seen on physical exam. Negative Homan's sign. No cords or calf tenderness. No significant  calf/ankle edema. Labs: Lab Results  Component Value Date   WBC 17.4 (H) 12/27/2020   HGB 12.6 12/27/2020   HCT 36.6 12/27/2020   MCV 86.5 12/27/2020   PLT 202 12/27/2020   No flowsheet data found. Edinburgh Score: Edinburgh Postnatal Depression Scale Screening Tool 12/27/2020  I have been able to laugh and see the funny side of things. 0  I have looked forward with enjoyment to things. 0  I have blamed myself unnecessarily when things went wrong. 1  I have been anxious or worried for no good reason. 1  I have felt scared or panicky for no good reason. 1  Things have been getting on top of me. 1  I have been so unhappy that I have had difficulty sleeping. 1  I have felt sad or miserable. 0  I have been so unhappy that I have been crying. 0  The thought of harming myself has occurred to me. 0  Edinburgh Postnatal Depression Scale Total 5      After visit meds:  Allergies as of 12/28/2020      Reactions   Penicillins Hives      Medication List    TAKE these medications   ibuprofen 600 MG tablet Commonly known as: ADVIL Take 1 tablet (600 mg total) by mouth every 6 (six) hours as needed.   multivitamin-prenatal 27-0.8 MG Tabs tablet  Take 1 tablet by mouth daily at 12 noon.        Discharge home in stable condition Infant Feeding: Breast Infant Disposition:home with mother Discharge instruction: per After Visit Summary  Activity: Advance as tolerated. Pelvic rest for 6 weeks.  Diet: routine diet Anticipated Birth Control: Condoms Postpartum Appointment:6 weeks Additional Postpartum F/U: None Future Appointments: Future Appointments  Date Time Provider Department Center  12/30/2020  9:45 AM [facility] ROOM [facility] None  12/30/2020 10:45 AM Evans, David James, MD [facility] None   Follow up Visit:  Follow-up Information    Cherry, Anika, MD Follow up in 6 week(s).   Specialties: Obstetrics and Gynecology, Radiology Why: postpartum visit Contact  information: [address] [place] [address] [phone]                   12/28/2020 Anika Cherry, MD  [facility]  

## 2020-12-28 NOTE — Lactation Note (Addendum)
This note was copied from a baby's chart. Lactation Consultation Note  Patient Name: Emily Farley Date: 12/28/2020   Age:27 hours   LC Student Jackalyn Haith [name] arrived in the room where infant was latched at the breast. Mother stated that the infant had been feeding for a few minutes. LC Student reviewed breastfeeding basics such as pumping, milk storage, engorgement, and mother stated she understood. She stated last night she had some difficulty with latching. She stated she watched a video on [employer] and did not have any issues after that. Mother stated she has a manual Medela pump at home.  Infant had a good latch and did not need any assistance. Audible swallows where heard. [facility] Student told mother if she had any other questions that she could call for assistance.   Maternal Data Does the patient have breastfeeding experience prior to this delivery?: Yes How long did the patient breastfeed?: 1 year +   LATCH Score Latch: Grasps breast easily, tongue down, lips flanged, rhythmical sucking.  Audible Swallowing: Spontaneous and intermittent  Type of Nipple: Everted at rest and after stimulation  Comfort (Breast/Nipple): Soft / non-tender  Hold (Positioning): No assistance needed to correctly position infant at breast.  LATCH Score: 10  Interventions Interventions: Education  Discharge Pump: Manual  Consult Status Consult Status: Follow-up Date: 12/28/20 Follow-up type: In-patient    Emily Farley 12/28/2020, 10:49 AM

## 2020-12-30 ENCOUNTER — Other Ambulatory Visit: Payer: Managed Care, Other (non HMO)

## 2020-12-30 ENCOUNTER — Encounter: Payer: Managed Care, Other (non HMO) | Admitting: Obstetrics and Gynecology

## 2021-02-09 ENCOUNTER — Encounter: Payer: Managed Care, Other (non HMO) | Admitting: Obstetrics and Gynecology

## 2021-03-29 ENCOUNTER — Telehealth: Payer: Self-pay | Admitting: Obstetrics and Gynecology

## 2021-03-29 NOTE — Telephone Encounter (Signed)
Patient is requesting a prescription for a breast pump

## 2021-03-30 NOTE — Telephone Encounter (Signed)
Pt called no answer LM via VM that her breast pump form has been completed and faxed in.

## 2021-06-07 ENCOUNTER — Ambulatory Visit
Admission: RE | Admit: 2021-06-07 | Discharge: 2021-06-07 | Disposition: A | Discharge status: Home / Self Care | Payer: Managed Care, Other (non HMO) | Source: Ambulatory Visit | Attending: Emergency Medicine | Admitting: Emergency Medicine

## 2021-06-07 ENCOUNTER — Other Ambulatory Visit: Payer: Self-pay

## 2021-06-07 VITALS — BP 116/77 | HR 63 | Temp 97.6°F | Resp 18

## 2021-06-07 DIAGNOSIS — R5383 Other fatigue: Secondary | ICD-10-CM

## 2021-06-07 DIAGNOSIS — M542 Cervicalgia: Secondary | ICD-10-CM

## 2021-06-07 LAB — POCT MONO SCREEN (KUC): Mono, POC: NEGATIVE

## 2021-06-07 NOTE — Discharge Instructions (Addendum)
Your mono test is negative.    Take Tylenol or ibuprofen as needed for discomfort.  Gently stretch and massage her neck.  Follow-up with your primary care provider if your symptoms are not improving.

## 2021-06-07 NOTE — ED Provider Notes (Signed)
Emily Farley    CSN: [id number] Arrival date & time: 06/07/21  1622      History   Chief Complaint Chief Complaint  Patient presents with   Neck Pain   Fatigue    HPI Emily Farley is a 25 y.o. female.  Patient presents with 3-week history of fatigue.  She also reports 3-day history of muscular pain in her left posterior neck.  This pain feels like tightness.  No falls or injury.  She denies fever, chills, rash, sore throat, earache, congestion, cough, shortness of breath, vomiting, diarrhea, or other symptoms.  Treatment attempted at home with ibuprofen.  Patient states her fatigue is similar to remote history of mono.  Patient is breast-feeding.  The history is provided by the patient and medical records.   Past Medical History:  Diagnosis Date   Headache     Patient Active Problem List   Diagnosis Date Noted   Normal labor 12/26/2020   Prior fetal macrosomia, antepartum, third trimester 12/26/2020   Post-dates pregnancy 01/11/2019    Past Surgical History:  Procedure Laterality Date   APPENDECTOMY      OB History     Gravida  3   Para  3   Term  3   Preterm      AB      Living  3      SAB      IAB      Ectopic      Multiple  0   Live Births  3        Obstetric Comments  H/o episiotomy in 1st labor          Home Medications    Prior to Admission medications   Medication Sig Start Date End Date Taking? Authorizing Provider  Multiple Vitamin (MULTIVITAMIN WITH MINERALS) TABS tablet Take 1 tablet by mouth daily.   Yes [provider]  UNKNOWN TO PATIENT Magnesium and vit D   Yes [provider]  ibuprofen (ADVIL) 600 MG tablet Take 1 tablet (600 mg total) by mouth every 6 (six) hours as needed. 12/28/20   Hildred Laser, MD  Prenatal Vit-Fe Fumarate-FA (MULTIVITAMIN-PRENATAL) 27-0.8 MG TABS tablet Take 1 tablet by mouth daily at 12 noon.    [provider]    Family History Family History  Problem  Relation Age of Onset   Hypertension Mother    Healthy Father     Social History Social History   Tobacco Use   Smoking status: Never   Smokeless tobacco: Never  Vaping Use   Vaping Use: Never used  Substance Use Topics   Alcohol use: Never   Drug use: Never     Allergies   Penicillins   Review of Systems Review of Systems  Constitutional:  Negative for chills and fever.  HENT:  Negative for ear pain and sore throat.   Respiratory:  Negative for cough and shortness of breath.   Cardiovascular:  Negative for chest pain and palpitations.  Gastrointestinal:  Negative for abdominal pain, diarrhea and vomiting.  Musculoskeletal:  Positive for neck pain. Negative for arthralgias, back pain and joint swelling.  Skin:  Negative for color change, rash and wound.  Neurological:  Negative for weakness and numbness.  All other systems reviewed and are negative.   Physical Exam Triage Vital Signs ED Triage Vitals  Enc Vitals Group     BP      Pulse      Resp  Temp      Temp src      SpO2      Weight      Height      Head Circumference      Peak Flow      Pain Score      Pain Loc      Pain Edu?      Excl. in GC?    No data found.  Updated Vital Signs BP 116/77 (BP Location: Left Arm)   Pulse 63   Temp 97.6 F (36.4 C) (Oral)   Resp 18   LMP 02/17/2021   SpO2 98%   Breastfeeding Yes   Visual Acuity Right Eye Distance:   Left Eye Distance:   Bilateral Distance:    Right Eye Near:   Left Eye Near:    Bilateral Near:     Physical Exam Vitals and nursing note reviewed.  Constitutional:      General: She is not in acute distress.    Appearance: She is well-developed. She is not ill-appearing.  HENT:     Head: Normocephalic and atraumatic.     Right Ear: Tympanic membrane normal.     Left Ear: Tympanic membrane normal.     Nose: Nose normal.     Mouth/Throat:     Mouth: Mucous membranes are moist.     Pharynx: Oropharynx is clear.  Eyes:      Conjunctiva/sclera: Conjunctivae normal.  Neck:     Comments: Left posterior muscular neck pain is worse with turning head side to side.  Nontender to palpation.  No palpable cervical lymphadenopathy. Cardiovascular:     Rate and Rhythm: Normal rate and regular rhythm.     Heart sounds: Normal heart sounds.  Pulmonary:     Effort: Pulmonary effort is normal. No respiratory distress.     Breath sounds: Normal breath sounds.  Abdominal:     Palpations: Abdomen is soft.     Tenderness: There is no abdominal tenderness.  Musculoskeletal:        General: No swelling, tenderness, deformity or signs of injury. Normal range of motion.     Cervical back: Neck supple. No rigidity or tenderness.  Lymphadenopathy:     Cervical: No cervical adenopathy.  Skin:    General: Skin is warm and dry.     Findings: No bruising, erythema, lesion or rash.  Neurological:     General: No focal deficit present.     Mental Status: She is alert and oriented to person, place, and time.     Sensory: No sensory deficit.     Motor: No weakness.     Gait: Gait normal.  Psychiatric:        Mood and Affect: Mood normal.        Behavior: Behavior normal.     UC Treatments / Results  Labs (all labs ordered are listed, but only abnormal results are displayed) Labs Reviewed  POCT MONO SCREEN [facility])    EKG   Radiology No results found.  Procedures Procedures (including critical care time)  Medications Ordered in UC Medications - No data to display  Initial Impression / Assessment and Plan / UC Course  I have reviewed the triage vital signs and the nursing notes.  Pertinent labs & imaging results that were available during my care of the patient were reviewed by me and considered in my medical decision making (see chart for details).  Left posterior muscular pain and neck.  Fatigue.  Patient  is breast-feeding. Patient reports her fatigue feels like previous remote episode of mono when she was a  teenager.  Mono test negative here today.  Patient is also concerned that she may be vitamin deficient because she is breast-feeding.  Discussed that she should follow-up with her PCP or OB/GYN for additional testing.  Discussed gentle stretching and massage for her neck; Tylenol or ibuprofen also.  Patient agrees to plan of care.   Final Clinical Impressions(s) / UC Diagnoses   Final diagnoses:  Neck pain  Fatigue, unspecified type     Discharge Instructions      Your mono test is negative.    Take Tylenol or ibuprofen as needed for discomfort.  Gently stretch and massage her neck.  Follow-up with your primary care provider if your symptoms are not improving.     ED Prescriptions   None    PDMP not reviewed this encounter.   Mickie Bail, NP 06/07/21 1721

## 2021-06-07 NOTE — ED Triage Notes (Addendum)
PT reports worsening neck pain for 3 days. Pain is creeping up to base of head.   Also reports increasing fatigue for 3 weeks

## 2022-05-04 IMAGING — US US OB COMP +14 WK
1 series · 13 of 28 positions shown · non-contrast
Comparison: none

CLINICAL DATA: Prior pregnancy with fetal macrosomia. Evaluate
fetal growth and presentation.

EXAM:
OBSTETRICAL ULTRASOUND >14 WKS

[Series 1: us ob comp + 14 wk · 13 of 61 slices shown]
[im 3/61]
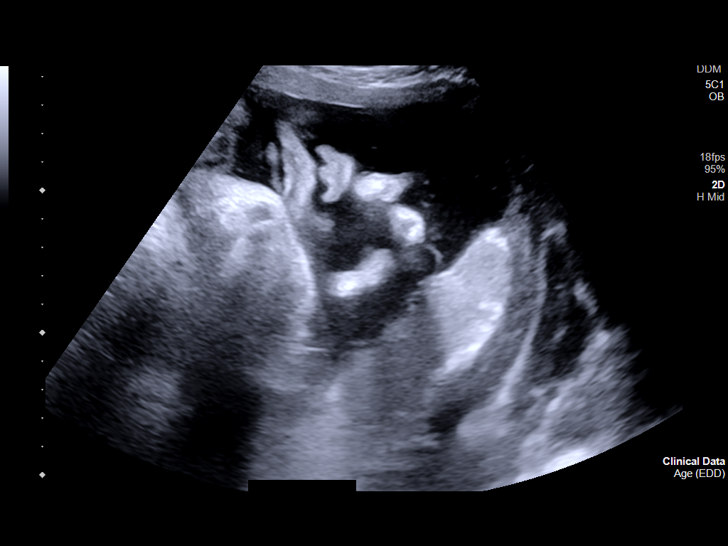
[im 7/61]
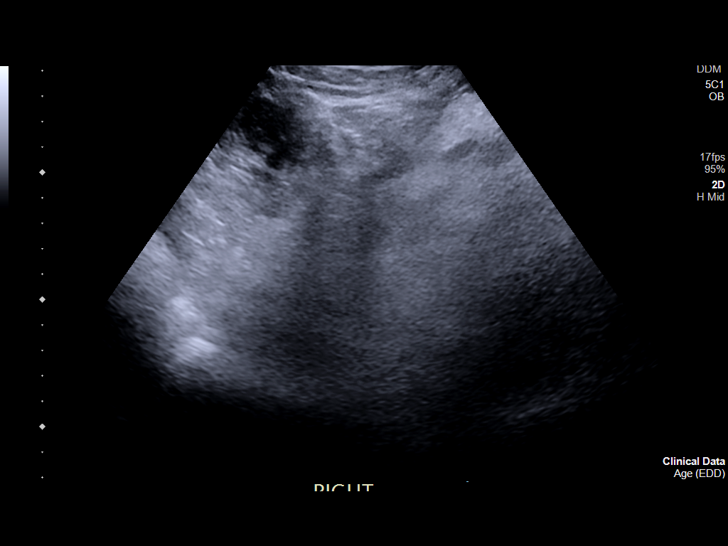
[im 12/61]
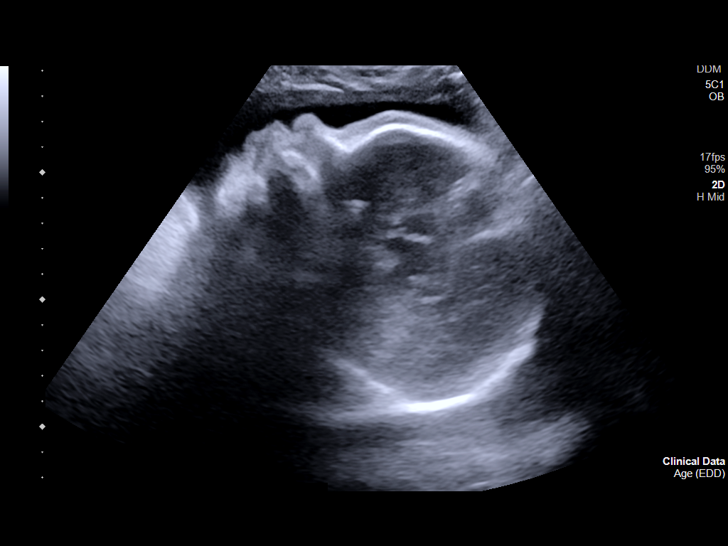
[im 16/61]
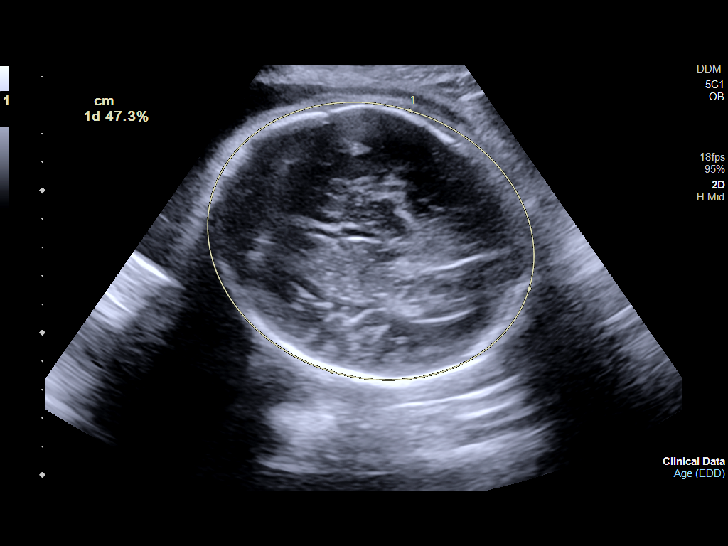
[im 21/61]
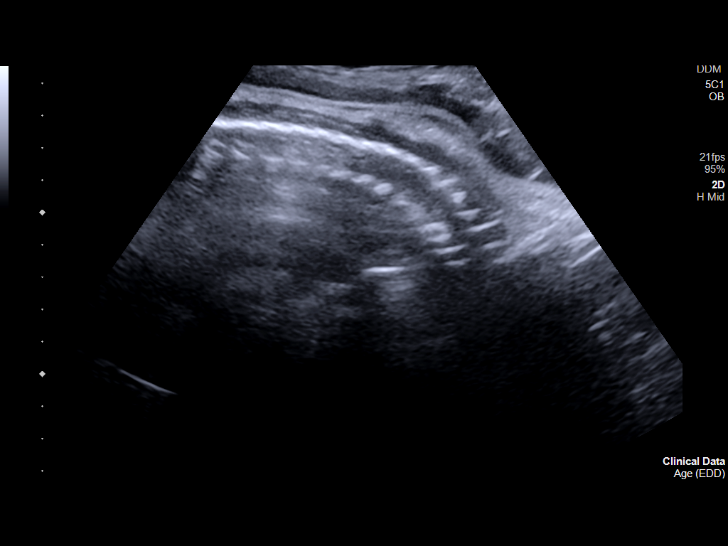
[im 25/61]
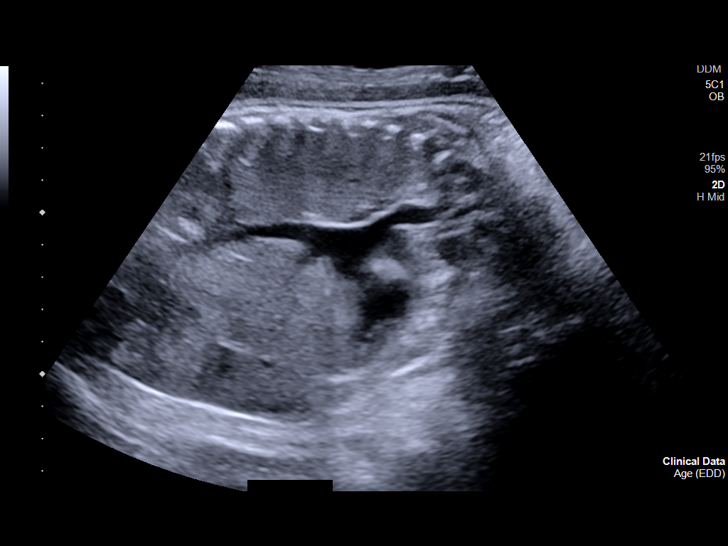
[im 32/61]
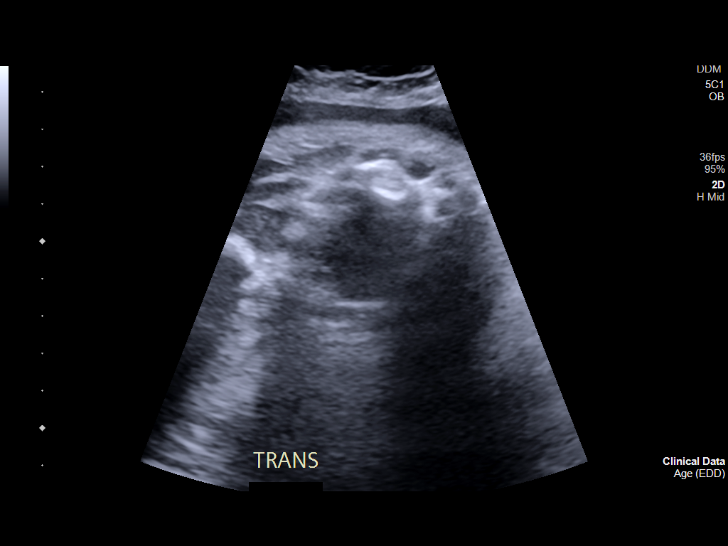
[im 36/61]
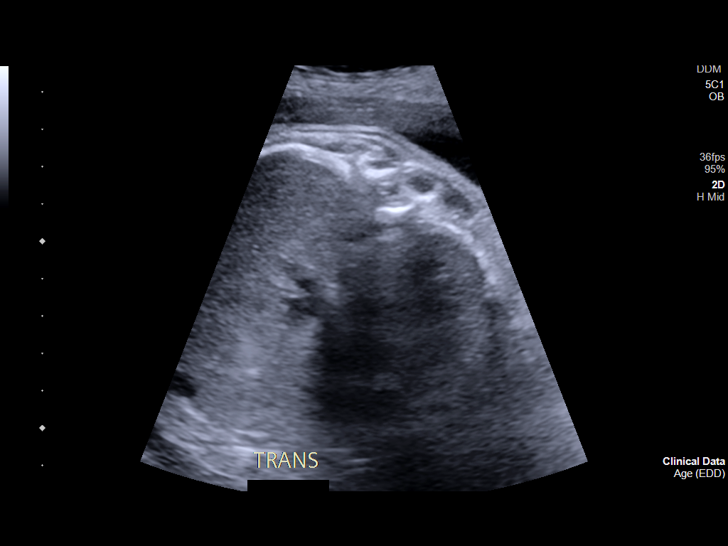
[im 41/61]
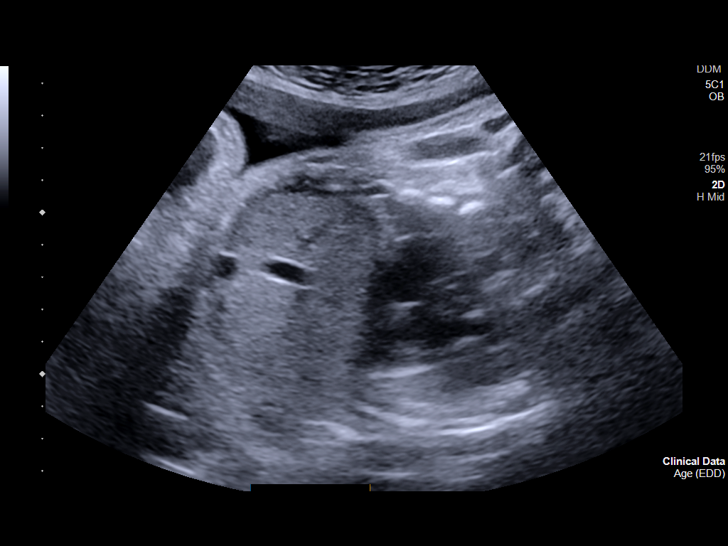
[im 45/61]
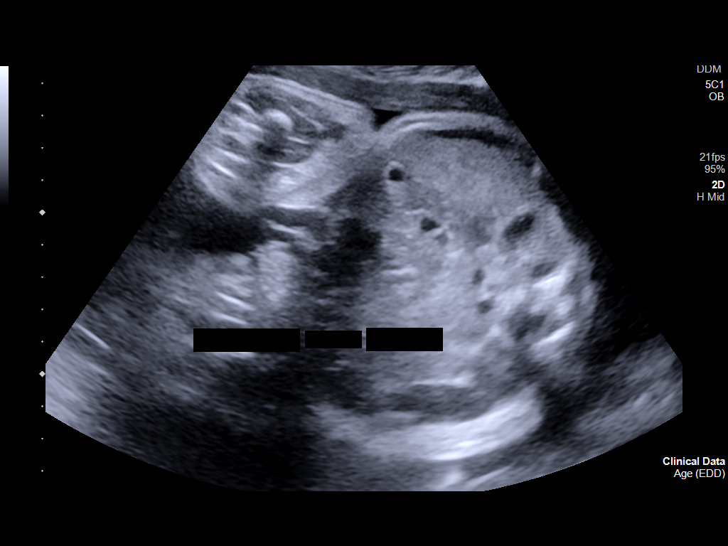
[im 49/61]
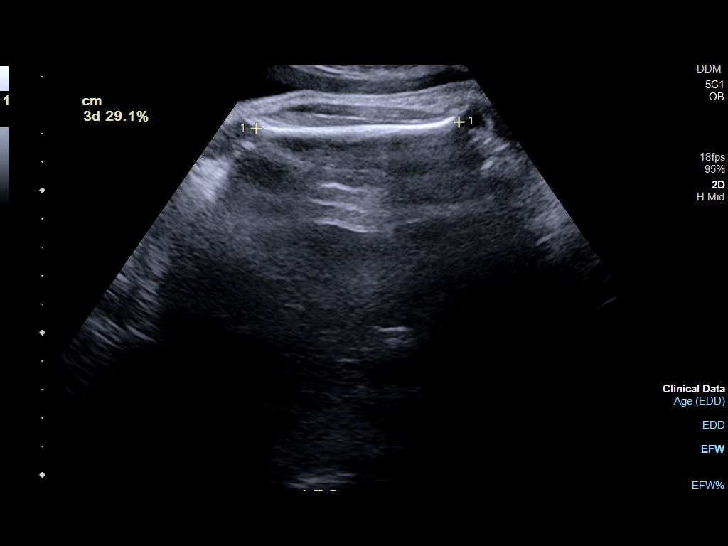
[im 54/61]
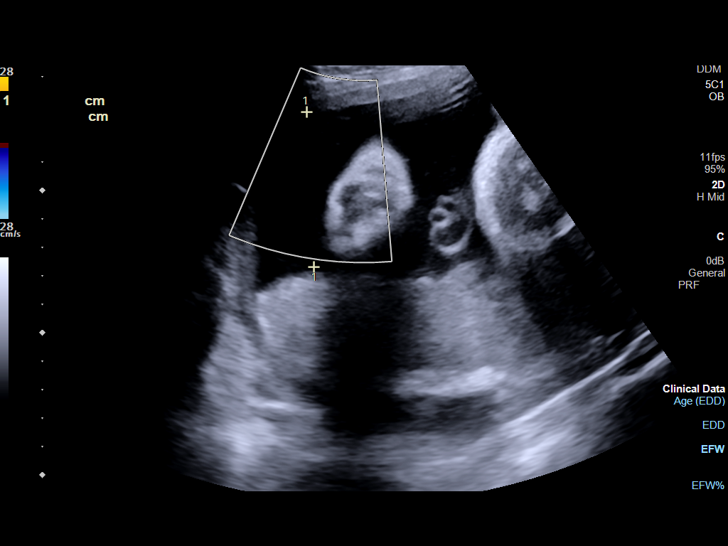
[im 58/61]
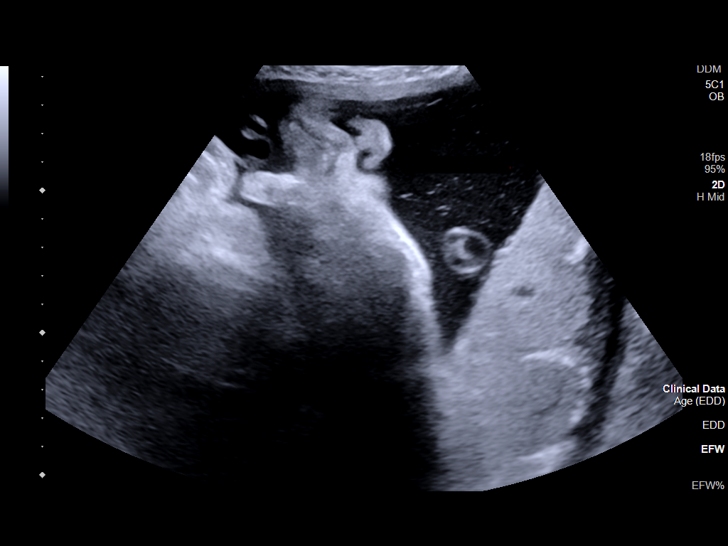

[13 of 28 positions shown; findings below may reference images not displayed]

FINDINGS: Number of Fetuses: 1

Heart Rate:  145 bpm

Movement: Yes

Presentation: Cephalic

Previa: No

Placental Location: Posterior

Amniotic Fluid (Subjective): Within normal limits

Amniotic Fluid (Objective):

Vertical pocket = 5.5cm

AFI = 19.0 cm (5%ile= 7.5 cm, 95%= 24.4 cm for 37 wks)

FETAL BIOMETRY

BPD: 9.3cm 37w 4d

HC:   33.0cm 38w 1d

AC:   34.0cm 37w 4d

FL:   7.1cm 36w 3d

Current Mean GA: 37w 1d US EDC: 12/28/2020

Assigned GA:  37w 2d Assigned EDC: 12/27/2020

Estimated Fetal Weight:  3,194g 60%ile

FETAL ANATOMY

Lateral Ventricles: Appears normal

Thalami/CSP: Appears normal

Posterior Fossa:  Not visualized

Nuchal Region: Not visualized   NFT= N/A > 20 WKS

Upper Lip: Appears normal

Spine: Appears normal

4 Chamber Heart on Left: Appears normal

LVOT: Appears normal

RVOT: Appears normal

Stomach on Left: Appears normal

3 Vessel Cord: Appears normal

Cord Insertion site: Appears normal

Kidneys: Appears normal

Bladder: Appears normal

Extremities: Appears normal

Technically difficult due to: Advanced gestational age and fetal
position

Maternal Findings:

Cervix:  Not evaluated (>34 wks)
IMPRESSION: Assigned GA currently 37 weeks 2 days. Appropriate fetal growth,
with EFW currently at 60 %ile.

Normal amniotic fluid volume.

Cephalic presentation.

## 2022-09-23 ENCOUNTER — Ambulatory Visit: Payer: Managed Care, Other (non HMO) | Admitting: Certified Nurse Midwife

## 2023-06-01 ENCOUNTER — Ambulatory Visit: Payer: Managed Care, Other (non HMO) | Admitting: Obstetrics and Gynecology

## 2023-06-16 ENCOUNTER — Ambulatory Visit (INDEPENDENT_AMBULATORY_CARE_PROVIDER_SITE_OTHER): Payer: Managed Care, Other (non HMO) | Admitting: Obstetrics

## 2023-06-16 ENCOUNTER — Encounter: Payer: Self-pay | Admitting: Obstetrics

## 2023-06-16 ENCOUNTER — Ambulatory Visit: Payer: Managed Care, Other (non HMO) | Admitting: Obstetrics and Gynecology

## 2023-06-16 ENCOUNTER — Other Ambulatory Visit (HOSPITAL_COMMUNITY)
Admission: RE | Admit: 2023-06-16 | Discharge: 2023-06-16 | Disposition: A | Discharge status: Home / Self Care | Payer: Managed Care, Other (non HMO) | Source: Ambulatory Visit | Attending: Obstetrics and Gynecology | Admitting: Obstetrics and Gynecology

## 2023-06-16 VITALS — BP 97/73 | HR 76 | Ht 63.0 in | Wt 129.0 lb

## 2023-06-16 DIAGNOSIS — Z124 Encounter for screening for malignant neoplasm of cervix: Secondary | ICD-10-CM

## 2023-06-16 DIAGNOSIS — Z01419 Encounter for gynecological examination (general) (routine) without abnormal findings: Secondary | ICD-10-CM

## 2023-06-16 MED ORDER — ESCITALOPRAM OXALATE 10 MG PO TABS: 10.0000 mg | ORAL_TABLET | Freq: Every day | ORAL | 6 refills | Status: AC

## 2023-06-16 MED ORDER — NORETHINDRONE 0.35 MG PO TABS: 1.0000 | ORAL_TABLET | Freq: Every day | ORAL | 3 refills | Status: AC

## 2023-06-16 MED ORDER — TRINATE PO TABS: 1.0000 | ORAL_TABLET | Freq: Every day | ORAL | 3 refills | Status: DC

## 2023-06-16 NOTE — Progress Notes (Signed)
ANNUAL GYNECOLOGICAL EXAM  SUBJECTIVE  HPI  Emily Farley is a 27 y.o.-year-old G3P3003 who presents for an annual gynecological exam today.  She denies pelvic pain, dyspareunia, abnormal vaginal bleeding or discharge, and UTI symptoms. She has been having severe mood swings and pain in her varicose veins with her period. She has concerns about possible rectocele as well as flap of skin over her rectum that has been present since the births of her children. She feels that she has fecal leakage also. She had a fourth degree laceration during her first birth.  Medical/Surgical History Past Medical History:  Diagnosis Date   Headache    Past Surgical History:  Procedure Laterality Date   APPENDECTOMY      Social History Lives with husband and 3 children. Feels safe there Work: [employer] Exercise: 20 minutes daily Substances: Denies EtOH, tobacco, vape, and recreational drugs  Obstetric History OB History     Gravida  3   Para  3   Term  3   Preterm      AB      Living  3      SAB      IAB      Ectopic      Multiple  0   Live Births  3        Obstetric Comments  H/o episiotomy in 1st labor          GYN/Menstrual History Patient's last menstrual period was 05/25/2023. Regular monthly periods Last Pap:     Component Value Date/Time   DIAGPAP  06/09/2020 1211    - Negative for intraepithelial lesion or malignancy (NILM)   ADEQPAP  06/09/2020 1211    Satisfactory for evaluation; transformation zone component PRESENT.   Contraception: Desires POPs  Prevention Mammogram: at 82 Colonoscopy: aunt with colon cancer in 37s -  consider early colonoscopy Flu shot/vaccines: declines  Current Medications Outpatient Medications Prior to Visit  Medication Sig   Multiple Vitamin (MULTIVITAMIN WITH MINERALS) TABS tablet Take 1 tablet by mouth daily.   [DISCONTINUED] ibuprofen (ADVIL) 600 MG tablet Take 1 tablet (600 mg total) by mouth every 6 (six) hours as  needed.   [DISCONTINUED] Prenatal Vit-Fe Fumarate-FA (MULTIVITAMIN-PRENATAL) 27-0.8 MG TABS tablet Take 1 tablet by mouth daily at 12 noon.   [DISCONTINUED] UNKNOWN TO PATIENT Magnesium and vit D   No facility-administered medications prior to visit.      [facility] - 06/16/23 1610       Pregnancy Intention Screening   Does the patient want to become pregnant in the next year? No    Does the patient's partner want to become pregnant in the next year? No    Would the patient like to discuss contraceptive options today? No      Contraception Wrap Up   Current Method No Contraceptive Precautions    End Method Oral Contraceptive    Contraception Counseling Provided Yes    How was the end contraceptive method provided? Provided on site            The pregnancy intention screening data noted above was reviewed. Potential methods of contraception were discussed. The patient elected to proceed with Oral Contraceptive.   ROS Constitutional: Denied constitutional symptoms, night sweats, recent illness, fatigue, fever, insomnia and weight loss.  Eyes: Denied eye symptoms, eye pain, photophobia, vision change and visual disturbance.  Ears/Nose/Throat/Neck: Denied ear, nose, throat or neck symptoms, hearing loss, nasal discharge, sinus congestion and sore throat.  Cardiovascular: Denied  cardiovascular symptoms, arrhythmia, chest pain/pressure, edema, exercise intolerance, orthopnea and palpitations.  Respiratory: Denied pulmonary symptoms, asthma, pleuritic pain, productive sputum, cough, dyspnea and wheezing.  Gastrointestinal: Denied, gastro-esophageal reflux, melena, nausea and vomiting. See HPI  Genitourinary: See HPI  Musculoskeletal: Denied musculoskeletal symptoms, stiffness, swelling, muscle weakness and myalgia.  Dermatologic: Denied dermatology symptoms, rash and scar.  Neurologic: Denied neurology symptoms, dizziness, headache, neck pain and syncope.  Psychiatric: Denied  psychiatric symptoms, anxiety and depression.  Endocrine: Denied endocrine symptoms including hot flashes and night sweats.    OBJECTIVE  Ht 5\' 3"  (1.6 m)   Wt 129 lb (58.5 kg)   LMP 05/25/2023   Breastfeeding No   BMI 22.85 kg/m    Physical examination General NAD, Conversant  HEENT Atraumatic; Op clear with mmm.  Normo-cephalic. Pupils reactive. Anicteric sclerae  Thyroid/Neck Smooth without nodularity or enlargement. Normal ROM.  Neck Supple.  Skin No rashes, lesions or ulceration. Normal palpated skin turgor. No nodularity.  Breasts: No masses or discharge.  Symmetric.  No axillary adenopathy.  Lungs: Clear to auscultation.No rales or wheezes. Normal Respiratory effort, no retractions.  Heart: NSR.  No murmurs or rubs appreciated. No peripheral edema  Abdomen: Soft.  Non-tender.  No masses.  No HSM. No hernia  Extremities: Moves all appropriately.  Normal ROM for age. No lymphadenopathy.  Neuro: Oriented to PPT.  Normal mood. Normal affect.     Pelvic:   Vulva: Normal appearance.  No lesions.  Vagina: No lesions or abnormalities noted.  Support: Mild cystocele   Urethra No masses tenderness or scarring.  Meatus Normal size without lesions or prolapse.  Cervix: Normal appearance.  No lesions.  Anus: Normal exam.  No lesions. Flap of skin from perineum overlying anus  Perineum: Normal exam.  No lesions.        Bimanual   Uterus: Normal size.  Non-tender.  Mobile.  AV.  Adnexae: No masses.  Non-tender to palpation.  Cul-de-sac: Negative for abnormality.    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 Discussed healthy lifestyle choices and preventive care. Declines STI testing. Labs: CBC, CMP, A1C, TSH, lipid profile. 2) Pap collected today. F/u based on results 3) Discussed options for PMDD. Would like to try PMDD dosing of SSRI. Rx sent  for Lexapro 10 mg. Take on first day of symptoms, continue through day 2-3 of menstrual bleeding. 4) Discussed progesterone-containing options (h/o migraine with aura). Kemyah would like to try POPs. Rx sent. Instructions reviewed. Recommend backup contraception x 1 week. 5) Refer to MD for follow up and potential surgical correction of perineal repair and cystocele/rectocele.  Return in one year for annual exam or as needed for concerns.   Guadlupe Spanish, CNM

## 2023-06-17 LAB — COMPREHENSIVE METABOLIC PANEL
ALT: 14 [IU]/L (ref 0–32)
AST: 18 [IU]/L (ref 0–40)
Albumin: 4.8 g/dL (ref 4.0–5.0)
Alkaline Phosphatase: 69 [IU]/L (ref 44–121)
BUN/Creatinine Ratio: 17 (ref 9–23)
BUN: 12 mg/dL (ref 6–20)
Bilirubin Total: 0.6 mg/dL (ref 0.0–1.2)
CO2: 22 mmol/L (ref 20–29)
Calcium: 9.6 mg/dL (ref 8.7–10.2)
Chloride: 102 mmol/L (ref 96–106)
Creatinine, Ser: 0.72 mg/dL (ref 0.57–1.00)
Globulin, Total: 2.6 g/dL (ref 1.5–4.5)
Glucose: 72 mg/dL (ref 70–99)
Potassium: 4.3 mmol/L (ref 3.5–5.2)
Sodium: 138 mmol/L (ref 134–144)
Total Protein: 7.4 g/dL (ref 6.0–8.5)
eGFR: 116 mL/min/{1.73_m2} (ref >59)

## 2023-06-17 LAB — CBC
Hematocrit: 43.8 % (ref 34.0–46.6)
Hemoglobin: 14.2 g/dL (ref 11.1–15.9)
MCH: 29.6 pg (ref 26.6–33.0)
MCHC: 32.4 g/dL (ref 31.5–35.7)
MCV: 91 fL (ref 79–97)
Platelets: 327 10*3/uL (ref 150–450)
RBC: 4.79 x10E6/uL (ref 3.77–5.28)
RDW: 12.5 % (ref 11.7–15.4)
WBC: 7.4 10*3/uL (ref 3.4–10.8)

## 2023-06-17 LAB — HEMOGLOBIN A1C
Est. average glucose Bld gHb Est-mCnc: 91 mg/dL
Hgb A1c MFr Bld: 4.8 % (ref 4.8–5.6)

## 2023-06-17 LAB — TSH: TSH: 1.69 u[IU]/mL (ref 0.450–4.500)

## 2023-06-17 LAB — LIPID PANEL
Chol/HDL Ratio: 4.2 {ratio} (ref 0.0–4.4)
Cholesterol, Total: 123 mg/dL (ref 100–199)
HDL: 29 mg/dL — ABNORMAL LOW (ref >39)
LDL Chol Calc (NIH): 76 mg/dL (ref 0–99)
Triglycerides: 96 mg/dL (ref 0–149)
VLDL Cholesterol Cal: 18 mg/dL (ref 5–40)

## 2023-06-20 ENCOUNTER — Encounter: Payer: Self-pay | Admitting: Obstetrics

## 2023-06-20 LAB — CYTOLOGY - PAP: Diagnosis: NEGATIVE

## 2023-06-22 ENCOUNTER — Ambulatory Visit: Payer: Managed Care, Other (non HMO) | Admitting: Obstetrics and Gynecology

## 2023-06-22 ENCOUNTER — Encounter: Payer: Self-pay | Admitting: Obstetrics and Gynecology

## 2023-06-22 VITALS — BP 110/75 | HR 61 | Ht 63.0 in | Wt 129.4 lb

## 2023-06-22 DIAGNOSIS — N816 Rectocele: Secondary | ICD-10-CM | POA: Diagnosis not present

## 2023-06-22 NOTE — Progress Notes (Signed)
HPI:      [name] is a 27 y.o. [phone] who LMP was Patient's last menstrual period was 05/25/2023.  Subjective:   She presents today because she has difficulty with bowel movements and with a skin bridge on the perineum that is resultant from a previous fourth degree repair after childbirth.  She reports that she has some issues with having bowel movements and emptying completely as well as this skin bridge causing issues with cleanliness. She reports no issues with her bladder.  She is very interested in having any posterior relaxation and especially at the skin bridge repaired.    Hx: The following portions of the patient's history were reviewed and updated as appropriate:             She  has a past medical history of Headache, Post-dates pregnancy (01/11/2019), and Prior fetal macrosomia, antepartum, third trimester (12/26/2020). She does not have any pertinent problems on file. She  has a past surgical history that includes Appendectomy. Her family history includes Healthy in her father; Hypertension in her mother. She  reports that she has never smoked. She has never used smokeless tobacco. She reports that she does not drink alcohol and does not use drugs. She has a current medication list which includes the following prescription(s): multivitamin with minerals, escitalopram, and norethindrone. She is allergic to penicillins.       Review of Systems:  Review of Systems  Constitutional: Denied constitutional symptoms, night sweats, recent illness, fatigue, fever, insomnia and weight loss.  Eyes: Denied eye symptoms, eye pain, photophobia, vision change and visual disturbance.  Ears/Nose/Throat/Neck: Denied ear, nose, throat or neck symptoms, hearing loss, nasal discharge, sinus congestion and sore throat.  Cardiovascular: Denied cardiovascular symptoms, arrhythmia, chest pain/pressure, edema, exercise intolerance, orthopnea and palpitations.  Respiratory: Denied pulmonary  symptoms, asthma, pleuritic pain, productive sputum, cough, dyspnea and wheezing.  Gastrointestinal: Denied, gastro-esophageal reflux, melena, nausea and vomiting.  Genitourinary: See HPI for additional information.  Musculoskeletal: Denied musculoskeletal symptoms, stiffness, swelling, muscle weakness and myalgia.  Dermatologic: Denied dermatology symptoms, rash and scar.  Neurologic: Denied neurology symptoms, dizziness, headache, neck pain and syncope.  Psychiatric: Denied psychiatric symptoms, anxiety and depression.  Endocrine: Denied endocrine symptoms including hot flashes and night sweats.   Meds:   Current Outpatient Medications on File Prior to Visit  Medication Sig Dispense Refill   Multiple Vitamin (MULTIVITAMIN WITH MINERALS) TABS tablet Take 1 tablet by mouth daily.     escitalopram (LEXAPRO) 10 MG tablet Take 1 tablet (10 mg total) by mouth daily. Take one tablet at the onset of symptoms. Take daily through day 2-3 of menses. (Patient not taking: Reported on 06/22/2023) 30 tablet 6   norethindrone (MICRONOR) 0.35 MG tablet Take 1 tablet (0.35 mg total) by mouth daily. (Patient not taking: Reported on 06/22/2023) 84 tablet 3   No current facility-administered medications on file prior to visit.      Objective:     Vitals:   06/22/23 1052  BP: 110/75  Pulse: 61   Filed Weights   06/22/23 1052  Weight: 129 lb 6.4 oz (58.7 kg)              Physical examination   Pelvic:   Vulva: Normal appearance.  No lesions.  Vagina: No lesions or abnormalities noted.  Support: Second-degree cystocele second-degree rectocele perineal skin bridge good uterine support  Urethra No masses tenderness or scarring.  Meatus Normal size without lesions or prolapse.  Cervix: Normal appearance.  No lesions.  Anus: Normal exam.  No lesions.  Perineum: Normal exam.  No lesions.             Assessment:    G3P3003 Patient Active Problem List   Diagnosis Date Noted   Well woman exam  06/16/2023     1. 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  She also has a perineal skin bridge which is leftover from a prior fourth degree tear.  This also causes issues for her.  Her general pelvic support is good including her uterus and anterior vagina.  She is interested in having definitive repair of the posterior performed.   Plan:            1.  I have discussed her diagnosis and possible surgical repair for this issue.  She seems interested in posterior repair and revision of perineal body.  She will follow-up for preop appointment when she desires. Orders No orders of the defined types were placed in this encounter.   No orders of the defined types were placed in this encounter.     F/U  No follow-ups on file. I spent 22 minutes involved in the care of this patient preparing to see the patient by obtaining and reviewing her medical history (including labs, imaging tests and prior procedures), documenting clinical information in the electronic health record (EHR), counseling and coordinating care plans, writing and sending prescriptions, ordering tests or procedures and in direct communicating with the patient and medical staff discussing pertinent items from her history and physical exam.  Elonda Husky, M.D. 06/22/2023 11:18 AM

## 2023-06-22 NOTE — Progress Notes (Signed)
Patient presents today to discuss surgical options. She states wanting a perineal repair due to a 4th degree tear in her last delivery as well as a possible prolapse repair. She states pain and discomfort with the two issues.

## 2023-07-12 ENCOUNTER — Ambulatory Visit (INDEPENDENT_AMBULATORY_CARE_PROVIDER_SITE_OTHER): Payer: Managed Care, Other (non HMO) | Admitting: Obstetrics and Gynecology

## 2023-07-12 ENCOUNTER — Encounter: Payer: Self-pay | Admitting: Obstetrics and Gynecology

## 2023-07-12 VITALS — BP 103/72 | HR 60 | Ht 63.0 in | Wt 129.0 lb

## 2023-07-12 DIAGNOSIS — Z01818 Encounter for other preprocedural examination: Secondary | ICD-10-CM | POA: Diagnosis not present

## 2023-07-12 DIAGNOSIS — N816 Rectocele: Secondary | ICD-10-CM | POA: Diagnosis not present

## 2023-07-12 NOTE — Progress Notes (Signed)
Patient presents today for a pre-op exam prior to posterior repair and perineal revision. She states no additional concerns today.

## 2023-07-12 NOTE — Progress Notes (Signed)
Reviewing somewhere I can order that right now      PRE-OPERATIVE HISTORY AND PHYSICAL EXAM  PCP:  Patient, No Pcp Per Subjective:   HPI:  Emily Farley is a 27 y.o. G3P3003.  Patient's last menstrual period was 06/23/2023.  She presents today for a pre-op discussion and PE.  She has the following symptoms: Rectocele with attenuated perineal body.  Review of Systems:   Constitutional: Denied constitutional symptoms, night sweats, recent illness, fatigue, fever, insomnia and weight loss.  Eyes: Denied eye symptoms, eye pain, photophobia, vision change and visual disturbance.  Ears/Nose/Throat/Neck: Denied ear, nose, throat or neck symptoms, hearing loss, nasal discharge, sinus congestion and sore throat.  Cardiovascular: Denied cardiovascular symptoms, arrhythmia, chest pain/pressure, edema, exercise intolerance, orthopnea and palpitations.  Respiratory: Denied pulmonary symptoms, asthma, pleuritic pain, productive sputum, cough, dyspnea and wheezing.  Gastrointestinal: Denied, gastro-esophageal reflux, melena, nausea and vomiting.  Genitourinary: See above  Musculoskeletal: Denied musculoskeletal symptoms, stiffness, swelling, muscle weakness and myalgia.  Dermatologic: Denied dermatology symptoms, rash and scar.  Neurologic: Denied neurology symptoms, dizziness, headache, neck pain and syncope.  Psychiatric: Denied psychiatric symptoms, anxiety and depression.  Endocrine: Denied endocrine symptoms including hot flashes and night sweats.   OB History  Gravida Para Term Preterm AB Living  3 3 3     3   SAB IAB Ectopic Multiple Live Births        0 3    # Outcome Date GA Lbr Len/2nd Weight Sex Type Anes PTL Lv  3 Term 12/26/20 [redacted]w[redacted]d 06:51 / 00:08 7 lb 6.9 oz (3.37 kg) F Vag-Spont EPI  LIV  2 Term 01/11/19 [redacted]w[redacted]d 00:42 / 00:33 8 lb 1.1 oz (3.66 kg) M Vag-Spont EPI, Local  LIV  1 Term 2017 [redacted]w[redacted]d  8 lb 14 oz (4.026 kg) M Vag-Spont   LIV    Obstetric Comments  H/o episiotomy in 1st labor     Past Medical History:  Diagnosis Date   Headache    Post-dates pregnancy 01/11/2019   Prior fetal macrosomia, antepartum, third trimester 12/26/2020    Past Surgical History:  Procedure Laterality Date   APPENDECTOMY        SOCIAL HISTORY:  Social History   Tobacco Use  Smoking Status Never  Smokeless Tobacco Never   Social History   Substance and Sexual Activity  Alcohol Use Never    Social History   Substance and Sexual Activity  Drug Use Never    Family History  Problem Relation Age of Onset   Hypertension Mother    Healthy Father     ALLERGIES:  Penicillins  MEDS:   Current Outpatient Medications on File Prior to Visit  Medication Sig Dispense Refill   Multiple Vitamin (MULTIVITAMIN WITH MINERALS) TABS tablet Take 1 tablet by mouth daily.     escitalopram (LEXAPRO) 10 MG tablet Take 1 tablet (10 mg total) by mouth daily. Take one tablet at the onset of symptoms. Take daily through day 2-3 of menses. (Patient not taking: Reported on 06/22/2023) 30 tablet 6   norethindrone (MICRONOR) 0.35 MG tablet Take 1 tablet (0.35 mg total) by mouth daily. (Patient not taking: Reported on 06/22/2023) 84 tablet 3   No current facility-administered medications on file prior to visit.    No orders of the defined types were placed in this encounter.    Physical examination BP 103/72   Pulse 60   Ht 5\' 3"  (1.6 m)   Wt 129 lb (58.5 kg)   LMP 06/23/2023  BMI 22.85 kg/m   General NAD, Conversant  HEENT Atraumatic; Op clear with mmm.  Normo-cephalic.  Anicteric sclerae  Thyroid/Neck Smooth without nodularity or enlargement. Normal ROM.  Neck Supple.  Skin No rashes, lesions or ulceration. Normal palpated skin turgor. No nodularity.  Breasts: No masses or discharge.  Symmetric.  No axillary adenopathy.  Lungs: Clear to auscultation.No rales or wheezes. Normal Respiratory effort, no retractions.  Heart: NSR.  No murmurs or rubs appreciated. No peripheral edema   Abdomen: Soft.  Non-tender.  No masses.  No HSM. No hernia  Extremities: Moves all appropriately.  Normal ROM for age. No lymphadenopathy.  Neuro: Oriented to PPT.  Normal mood. Normal affect.     Pelvic:   Vulva: Normal appearance.  No lesions.  Vagina: No lesions or abnormalities noted.  Support: Second-degree cystocele second-degree rectocele perineal skin bridge good uterine support attenuated perineal body  Urethra No masses tenderness or scarring.  Meatus Normal size without lesions or prolapse.  Cervix: Normal ectropion.  No lesions.  Anus: Normal exam.  No lesions.  Perineum: Normal exam.  No lesions.        Bimanual   Uterus: Normal size.  Non-tender.  Mobile.  AV.  Adnexae: No masses.  Non-tender to palpation.  Cul-de-sac: Negative for abnormality.   Assessment:   G3P3003 Patient Active Problem List   Diagnosis Date Noted   Well woman exam 06/16/2023    1. Pre-op exam   2. 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   Plan:   Orders: No orders of the defined types were placed in this encounter.    1.  Posterior repair, perineoplasty  Posterior Repair Posterior repair was discussed with the patient.  The risks were reviewed and include:  possible damage to rectum and bowel, bleeding, infection and anesthesia.  The benefits were also discussed.  Post-op recovery with special attention to same-day surgery and return to sexual function were specifically reviewed.  All her questions were answered, and I believe that she has an informed understanding of Posterior repair.   I spent 32 minutes involved in the care of this patient preparing to see the patient by obtaining and reviewing her medical history (including labs, imaging tests and prior procedures), documenting clinical information in the electronic health record (EHR), counseling and coordinating care plans, writing and sending  prescriptions, ordering tests or procedures and in direct communicating with the patient and medical staff discussing pertinent items from her history and physical exam.  Elonda Husky, M.D. 07/12/2023 8:35 AM

## 2023-07-12 NOTE — H&P (Signed)
Reviewing somewhere I can order that right now      PRE-OPERATIVE HISTORY AND PHYSICAL EXAM  PCP:  Patient, No Pcp Per Subjective:   HPI:  Emily Farley is a 27 y.o. G3P3003.  Patient's last menstrual period was 06/23/2023.  She presents today for a pre-op discussion and PE.  She has the following symptoms: Rectocele with attenuated perineal body.  Review of Systems:   Constitutional: Denied constitutional symptoms, night sweats, recent illness, fatigue, fever, insomnia and weight loss.  Eyes: Denied eye symptoms, eye pain, photophobia, vision change and visual disturbance.  Ears/Nose/Throat/Neck: Denied ear, nose, throat or neck symptoms, hearing loss, nasal discharge, sinus congestion and sore throat.  Cardiovascular: Denied cardiovascular symptoms, arrhythmia, chest pain/pressure, edema, exercise intolerance, orthopnea and palpitations.  Respiratory: Denied pulmonary symptoms, asthma, pleuritic pain, productive sputum, cough, dyspnea and wheezing.  Gastrointestinal: Denied, gastro-esophageal reflux, melena, nausea and vomiting.  Genitourinary: See above  Musculoskeletal: Denied musculoskeletal symptoms, stiffness, swelling, muscle weakness and myalgia.  Dermatologic: Denied dermatology symptoms, rash and scar.  Neurologic: Denied neurology symptoms, dizziness, headache, neck pain and syncope.  Psychiatric: Denied psychiatric symptoms, anxiety and depression.  Endocrine: Denied endocrine symptoms including hot flashes and night sweats.   OB History  Gravida Para Term Preterm AB Living  3 3 3     3   SAB IAB Ectopic Multiple Live Births        0 3    # Outcome Date GA Lbr Len/2nd Weight Sex Type Anes PTL Lv  3 Term 12/26/20 [redacted]w[redacted]d 06:51 / 00:08 7 lb 6.9 oz (3.37 kg) F Vag-Spont EPI  LIV  2 Term 01/11/19 [redacted]w[redacted]d 00:42 / 00:33 8 lb 1.1 oz (3.66 kg) M Vag-Spont EPI, Local  LIV  1 Term 2017 [redacted]w[redacted]d  8 lb 14 oz (4.026 kg) M Vag-Spont   LIV    Obstetric Comments  H/o episiotomy in 1st labor     Past Medical History:  Diagnosis Date   Headache    Post-dates pregnancy 01/11/2019   Prior fetal macrosomia, antepartum, third trimester 12/26/2020    Past Surgical History:  Procedure Laterality Date   APPENDECTOMY        SOCIAL HISTORY:  Social History   Tobacco Use  Smoking Status Never  Smokeless Tobacco Never   Social History   Substance and Sexual Activity  Alcohol Use Never    Social History   Substance and Sexual Activity  Drug Use Never    Family History  Problem Relation Age of Onset   Hypertension Mother    Healthy Father     ALLERGIES:  Penicillins  MEDS:   Current Outpatient Medications on File Prior to Visit  Medication Sig Dispense Refill   Multiple Vitamin (MULTIVITAMIN WITH MINERALS) TABS tablet Take 1 tablet by mouth daily.     escitalopram (LEXAPRO) 10 MG tablet Take 1 tablet (10 mg total) by mouth daily. Take one tablet at the onset of symptoms. Take daily through day 2-3 of menses. (Patient not taking: Reported on 06/22/2023) 30 tablet 6   norethindrone (MICRONOR) 0.35 MG tablet Take 1 tablet (0.35 mg total) by mouth daily. (Patient not taking: Reported on 06/22/2023) 84 tablet 3   No current facility-administered medications on file prior to visit.    No orders of the defined types were placed in this encounter.    Physical examination BP 103/72   Pulse 60   Ht 5\' 3"  (1.6 m)   Wt 129 lb (58.5 kg)   LMP 06/23/2023  BMI 22.85 kg/m   General NAD, Conversant  HEENT Atraumatic; Op clear with mmm.  Normo-cephalic.  Anicteric sclerae  Thyroid/Neck Smooth without nodularity or enlargement. Normal ROM.  Neck Supple.  Skin No rashes, lesions or ulceration. Normal palpated skin turgor. No nodularity.  Breasts: No masses or discharge.  Symmetric.  No axillary adenopathy.  Lungs: Clear to auscultation.No rales or wheezes. Normal Respiratory effort, no retractions.  Heart: NSR.  No murmurs or rubs appreciated. No peripheral edema   Abdomen: Soft.  Non-tender.  No masses.  No HSM. No hernia  Extremities: Moves all appropriately.  Normal ROM for age. No lymphadenopathy.  Neuro: Oriented to PPT.  Normal mood. Normal affect.     Pelvic:   Vulva: Normal appearance.  No lesions.  Vagina: No lesions or abnormalities noted.  Support: Second-degree cystocele second-degree rectocele perineal skin bridge good uterine support attenuated perineal body  Urethra No masses tenderness or scarring.  Meatus Normal size without lesions or prolapse.  Cervix: Normal ectropion.  No lesions.  Anus: Normal exam.  No lesions.  Perineum: Normal exam.  No lesions.        Bimanual   Uterus: Normal size.  Non-tender.  Mobile.  AV.  Adnexae: No masses.  Non-tender to palpation.  Cul-de-sac: Negative for abnormality.   Assessment:   G3P3003 Patient Active Problem List   Diagnosis Date Noted   Well woman exam 06/16/2023    1. Pre-op exam   2. 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   Plan:   Orders: No orders of the defined types were placed in this encounter.    1.  Posterior repair, perineoplasty

## 2023-07-31 ENCOUNTER — Encounter
Admission: RE | Admit: 2023-07-31 | Discharge: 2023-07-31 | Disposition: A | Discharge status: Home / Self Care | Payer: Managed Care, Other (non HMO) | Source: Ambulatory Visit | Attending: Obstetrics and Gynecology | Admitting: Obstetrics and Gynecology

## 2023-07-31 ENCOUNTER — Other Ambulatory Visit: Payer: Self-pay

## 2023-07-31 DIAGNOSIS — Z01419 Encounter for gynecological examination (general) (routine) without abnormal findings: Secondary | ICD-10-CM

## 2023-07-31 DIAGNOSIS — N8189 Other female genital prolapse: Secondary | ICD-10-CM

## 2023-07-31 DIAGNOSIS — Z01812 Encounter for preprocedural laboratory examination: Secondary | ICD-10-CM

## 2023-07-31 NOTE — Patient Instructions (Addendum)
Your procedure is scheduled on: 08/07/2023 Monday Report to the Registration Desk on the 1st floor of the [facility]. To find out your arrival time, please call [phone] between 1PM - 3PM on:  08/04/2023 Friday If your arrival time is 6:00 am, do not arrive before that time as the [facility] entrance doors do not open until 6:00 am.  REMEMBER: Instructions that are not followed completely may result in serious medical risk, up to and including death; or upon the discretion of your surgeon and anesthesiologist your surgery may need to be rescheduled.  Do not eat food after midnight the night before surgery.  No gum chewing or hard candies.  One week prior to surgery: Stop Anti-inflammatories (NSAIDS) such as Advil, Aleve, Ibuprofen, Motrin, Naproxen, Naprosyn and Aspirin based products such as Excedrin, Goody's Powder, BC Powder. Stop ANY OVER THE COUNTER supplements until after surgery like multivitamin  You may however, continue to take Tylenol if needed for pain up until the day of surgery.   Continue taking all of your other prescription medications up until the day of surgery.  ON THE DAY OF SURGERY ONLY TAKE THESE MEDICATIONS WITH SIPS OF WATER:  escitalopram (LEXAPRO)     No Alcohol for 24 hours before or after surgery.  No Smoking including e-cigarettes for 24 hours before surgery.  No chewable tobacco products for at least 6 hours before surgery.  No nicotine patches on the day of surgery.  Do not use any "recreational" drugs for at least a week (preferably 2 weeks) before your surgery.  Please be advised that the combination of cocaine and anesthesia may have negative outcomes, up to and including death. If you test positive for cocaine, your surgery will be cancelled.  On the morning of surgery brush your teeth with toothpaste and water, you may rinse your mouth with mouthwash if you wish. Do not swallow any toothpaste or mouthwash.  Please shower on day of  surgery.  Do not wear jewelry, make-up, hairpins, clips or nail polish.  For welded (permanent) jewelry: bracelets, anklets, waist bands, etc.  Please have this removed prior to surgery.  If it is not removed, there is a chance that hospital personnel will need to cut it off on the day of surgery.  Do not wear lotions, powders, or perfumes or deodorant   Do not shave body hair from the neck down 48 hours before surgery.  Contact lenses, hearing aids and dentures may not be worn into surgery.  Do not bring valuables to the hospital. [facility] is not responsible for any missing/lost belongings or valuables.   Notify your doctor if there is any change in your medical condition (cold, fever, infection).  Wear comfortable clothing (specific to your surgery type) to the hospital.  After surgery, you can help prevent lung complications by doing breathing exercises.  Take deep breaths and cough every 1-2 hours. Your doctor may order a device called an Incentive Spirometer to help you take deep breaths.   If you are being admitted to the hospital overnight, leave your suitcase in the car. After surgery it may be brought to your room.  If you are being discharged the day of surgery, you will not be allowed to drive home. You will need a responsible individual to drive you home and stay with you for 24 hours after surgery.    Please call the Pre-admissions Testing Dept. at [phone] if you have any questions about these instructions.  Surgery Visitation  Policy:  Patients having surgery or a procedure may have two visitors.  Children under the age of 17 must have an adult with them who is not the patient.

## 2023-08-01 ENCOUNTER — Telehealth: Payer: Self-pay | Admitting: Obstetrics and Gynecology

## 2023-08-01 ENCOUNTER — Telehealth: Payer: Self-pay

## 2023-08-01 NOTE — Telephone Encounter (Signed)
Called patient to give her information to contact pre admit at [phone]. Then Vernon Prey, I will be in touch with her for rescheduling.Marland Kitchen

## 2023-08-01 NOTE — Telephone Encounter (Signed)
Pt calling; pt is scheduled for surgery on Mon the 18th; a week ago she started having flu sxs; thought she was getting better and some things are better but she has a gurgling crackling sound in her chest after she coughs.  She is going to [facility] today to have it checked out.  Is assuming they will rx her antibiotics for fluid in her chest.  She knows this isn't great conditions going into surgery.  What are her options?  [phone]

## 2023-08-02 ENCOUNTER — Inpatient Hospital Stay: Admission: RE | Admit: 2023-08-02 | Payer: Managed Care, Other (non HMO) | Source: Ambulatory Visit

## 2023-08-03 NOTE — Telephone Encounter (Signed)
Patient has been rescheduled for additional pre op appointment due to time frame beyond 30 days.

## 2023-08-07 ENCOUNTER — Ambulatory Visit
Admission: RE | Admit: 2023-08-07 | Payer: Managed Care, Other (non HMO) | Source: Home / Self Care | Admitting: Obstetrics and Gynecology

## 2023-08-07 ENCOUNTER — Encounter: Admission: RE | Payer: Self-pay | Source: Home / Self Care

## 2023-08-07 SURGERY — POSTERIOR REPAIR (RECTOCELE)
Anesthesia: Choice

## 2023-08-15 ENCOUNTER — Encounter: Payer: Managed Care, Other (non HMO) | Admitting: Obstetrics and Gynecology

## 2024-01-15 ENCOUNTER — Encounter: Payer: Self-pay | Admitting: Obstetrics and Gynecology

## 2024-01-15 DIAGNOSIS — Z3041 Encounter for surveillance of contraceptive pills: Secondary | ICD-10-CM

## 2024-01-16 MED ORDER — SLYND 4 MG PO TABS: 1.0000 | ORAL_TABLET | Freq: Every day | ORAL | 2 refills | Status: DC

## 2024-01-17 ENCOUNTER — Telehealth: Payer: Self-pay

## 2024-01-19 NOTE — Telephone Encounter (Signed)
 Emily Farley

## 2024-01-19 NOTE — Telephone Encounter (Signed)
 PA and clinical information sent via [employer].

## 2024-03-22 ENCOUNTER — Other Ambulatory Visit: Payer: Self-pay | Admitting: Obstetrics and Gynecology

## 2024-03-22 DIAGNOSIS — Z3041 Encounter for surveillance of contraceptive pills: Secondary | ICD-10-CM

## 2024-04-05 ENCOUNTER — Encounter: Payer: Self-pay | Admitting: Obstetrics and Gynecology

## 2024-04-08 ENCOUNTER — Other Ambulatory Visit: Payer: Self-pay

## 2024-04-08 DIAGNOSIS — Z3041 Encounter for surveillance of contraceptive pills: Secondary | ICD-10-CM

## 2024-04-08 MED ORDER — SLYND 4 MG PO TABS: 1.0000 | ORAL_TABLET | Freq: Every day | ORAL | 2 refills | Status: DC

## 2024-04-08 MED ORDER — SLYND 4 MG PO TABS: 1.0000 | ORAL_TABLET | Freq: Every day | ORAL | 2 refills | Status: AC

## 2024-04-08 NOTE — Addendum Note (Signed)
 Addended by: ALAINA WADDELL SAUNDERS on: 04/08/2024 08:16 AM   Modules accepted: Orders
